# Patient Record
Sex: Female | Born: 1990 | Hispanic: No | Marital: Single | Smoking: Never smoker
Health system: Southern US, Community
[De-identification: ages and names within clinical notes are randomized; demographics above are authoritative.]

## PROBLEM LIST (undated history)

## (undated) DIAGNOSIS — Z8742 Personal history of other diseases of the female genital tract: Secondary | ICD-10-CM

## (undated) DIAGNOSIS — R55 Syncope and collapse: Secondary | ICD-10-CM

## (undated) DIAGNOSIS — E669 Obesity, unspecified: Secondary | ICD-10-CM

## (undated) DIAGNOSIS — E039 Hypothyroidism, unspecified: Secondary | ICD-10-CM

## (undated) DIAGNOSIS — R1013 Epigastric pain: Secondary | ICD-10-CM

## (undated) DIAGNOSIS — E063 Autoimmune thyroiditis: Secondary | ICD-10-CM

## (undated) DIAGNOSIS — F32A Depression, unspecified: Secondary | ICD-10-CM

## (undated) DIAGNOSIS — T7840XA Allergy, unspecified, initial encounter: Secondary | ICD-10-CM

## (undated) DIAGNOSIS — K219 Gastro-esophageal reflux disease without esophagitis: Secondary | ICD-10-CM

## (undated) DIAGNOSIS — E049 Nontoxic goiter, unspecified: Secondary | ICD-10-CM

## (undated) DIAGNOSIS — F419 Anxiety disorder, unspecified: Secondary | ICD-10-CM

## (undated) DIAGNOSIS — I10 Essential (primary) hypertension: Secondary | ICD-10-CM

## (undated) HISTORY — DX: Obesity, unspecified: E66.9

## (undated) HISTORY — DX: Anxiety disorder, unspecified: F41.9

## (undated) HISTORY — DX: Autoimmune thyroiditis: E06.3

## (undated) HISTORY — DX: Allergy, unspecified, initial encounter: T78.40XA

## (undated) HISTORY — DX: Syncope and collapse: R55

## (undated) HISTORY — DX: Epigastric pain: R10.13

## (undated) HISTORY — DX: Gastro-esophageal reflux disease without esophagitis: K21.9

## (undated) HISTORY — DX: Depression, unspecified: F32.A

## (undated) HISTORY — DX: Personal history of other diseases of the female genital tract: Z87.42

## (undated) HISTORY — PX: TYMPANOSTOMY TUBE PLACEMENT: SHX32

## (undated) HISTORY — DX: Essential (primary) hypertension: I10

## (undated) HISTORY — DX: Hypothyroidism, unspecified: E03.9

## (undated) HISTORY — DX: Nontoxic goiter, unspecified: E04.9

## (undated) HISTORY — PX: CLEFT PALATE REPAIR: SUR1165

---
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
## 2012-03-28 ENCOUNTER — Other Ambulatory Visit: Payer: Self-pay | Admitting: *Deleted

## 2012-03-28 DIAGNOSIS — IMO0002 Reserved for concepts with insufficient information to code with codable children: Secondary | ICD-10-CM

## 2012-03-28 DIAGNOSIS — E1065 Type 1 diabetes mellitus with hyperglycemia: Secondary | ICD-10-CM

## 2012-03-31 ENCOUNTER — Other Ambulatory Visit: Payer: Self-pay | Admitting: *Deleted

## 2012-03-31 DIAGNOSIS — R1013 Epigastric pain: Secondary | ICD-10-CM

## 2012-03-31 MED ORDER — RANITIDINE HCL 150 MG PO TABS: 150.0000 mg | ORAL_TABLET | Freq: Two times a day (BID) | ORAL | Status: DC

## 2012-04-01 ENCOUNTER — Other Ambulatory Visit: Payer: Self-pay | Admitting: *Deleted

## 2012-04-01 DIAGNOSIS — R1013 Epigastric pain: Secondary | ICD-10-CM

## 2012-04-01 MED ORDER — RANITIDINE HCL 150 MG PO TABS: 150.0000 mg | ORAL_TABLET | Freq: Two times a day (BID) | ORAL | Status: DC

## 2012-04-22 ENCOUNTER — Ambulatory Visit: Payer: Self-pay | Admitting: "Endocrinology

## 2012-10-06 ENCOUNTER — Ambulatory Visit: Payer: Self-pay

## 2012-10-08 ENCOUNTER — Encounter (HOSPITAL_COMMUNITY): Payer: Self-pay | Admitting: Emergency Medicine

## 2012-10-08 ENCOUNTER — Emergency Department (HOSPITAL_COMMUNITY)
Admission: EM | Admit: 2012-10-08 | Discharge: 2012-10-08 | Disposition: A | Discharge status: Home / Self Care | Payer: Self-pay | Attending: Emergency Medicine | Admitting: Emergency Medicine

## 2012-10-08 DIAGNOSIS — Z113 Encounter for screening for infections with a predominantly sexual mode of transmission: Secondary | ICD-10-CM

## 2012-10-08 DIAGNOSIS — Z202 Contact with and (suspected) exposure to infections with a predominantly sexual mode of transmission: Secondary | ICD-10-CM | POA: Insufficient documentation

## 2012-10-08 DIAGNOSIS — Z3202 Encounter for pregnancy test, result negative: Secondary | ICD-10-CM | POA: Insufficient documentation

## 2012-10-08 DIAGNOSIS — N898 Other specified noninflammatory disorders of vagina: Secondary | ICD-10-CM | POA: Insufficient documentation

## 2012-10-08 DIAGNOSIS — N949 Unspecified condition associated with female genital organs and menstrual cycle: Secondary | ICD-10-CM | POA: Insufficient documentation

## 2012-10-08 DIAGNOSIS — R102 Pelvic and perineal pain: Secondary | ICD-10-CM

## 2012-10-08 DIAGNOSIS — E669 Obesity, unspecified: Secondary | ICD-10-CM | POA: Insufficient documentation

## 2012-10-08 DIAGNOSIS — I10 Essential (primary) hypertension: Secondary | ICD-10-CM | POA: Insufficient documentation

## 2012-10-08 DIAGNOSIS — E039 Hypothyroidism, unspecified: Secondary | ICD-10-CM | POA: Insufficient documentation

## 2012-10-08 DIAGNOSIS — E049 Nontoxic goiter, unspecified: Secondary | ICD-10-CM | POA: Insufficient documentation

## 2012-10-08 DIAGNOSIS — E069 Thyroiditis, unspecified: Secondary | ICD-10-CM | POA: Insufficient documentation

## 2012-10-08 DIAGNOSIS — E119 Type 2 diabetes mellitus without complications: Secondary | ICD-10-CM | POA: Insufficient documentation

## 2012-10-08 DIAGNOSIS — Z8742 Personal history of other diseases of the female genital tract: Secondary | ICD-10-CM | POA: Insufficient documentation

## 2012-10-08 DIAGNOSIS — J45909 Unspecified asthma, uncomplicated: Secondary | ICD-10-CM | POA: Insufficient documentation

## 2012-10-08 LAB — COMPREHENSIVE METABOLIC PANEL
Albumin: 3.9 g/dL (ref 3.5–5.2)
Alkaline Phosphatase: 43 U/L (ref 39–117)
BUN: 10 mg/dL (ref 6–23)
Potassium: 3.5 mEq/L (ref 3.5–5.1)
Sodium: 136 mEq/L (ref 135–145)
Total Protein: 8.2 g/dL (ref 6.0–8.3)

## 2012-10-08 LAB — URINALYSIS, ROUTINE W REFLEX MICROSCOPIC
Bilirubin Urine: NEGATIVE
Leukocytes, UA: NEGATIVE
Nitrite: NEGATIVE
Specific Gravity, Urine: 1.027 (ref 1.005–1.030)
pH: 6.5 (ref 5.0–8.0)

## 2012-10-08 LAB — CBC WITH DIFFERENTIAL/PLATELET
Basophils Relative: 0 % (ref 0–1)
Eosinophils Absolute: 0.1 10*3/uL (ref 0.0–0.7)
MCH: 30.3 pg (ref 26.0–34.0)
MCHC: 34 g/dL (ref 30.0–36.0)
Monocytes Relative: 8 % (ref 3–12)
Neutrophils Relative %: 69 % (ref 43–77)
Platelets: 299 10*3/uL (ref 150–400)

## 2012-10-08 LAB — WET PREP, GENITAL: Clue Cells Wet Prep HPF POC: NONE SEEN

## 2012-10-08 LAB — LIPASE, BLOOD: Lipase: 18 U/L (ref 11–59)

## 2012-10-08 LAB — POCT PREGNANCY, URINE: Preg Test, Ur: NEGATIVE

## 2012-10-08 MED ORDER — AZITHROMYCIN 250 MG PO TABS
1000.0000 mg | ORAL_TABLET | Freq: Once | ORAL | Status: AC
  Administered 2012-10-08: 1000 mg via ORAL
  Filled 2012-10-08: qty 4

## 2012-10-08 MED ORDER — CEFTRIAXONE SODIUM 250 MG IJ SOLR
250.0000 mg | Freq: Once | INTRAMUSCULAR | Status: AC
  Administered 2012-10-08: 250 mg via INTRAMUSCULAR
  Filled 2012-10-08: qty 250

## 2012-10-08 NOTE — ED Provider Notes (Signed)
  Medical screening examination/treatment/procedure(s) were performed by non-physician practitioner and as supervising physician I was immediately available for consultation/collaboration.    Leaf [employer], MD 10/08/12 1600 

## 2012-10-08 NOTE — ED Notes (Signed)
Lower abd pain x 3 days has a vga d/c  Clear and  wanst std check and preg test

## 2012-10-08 NOTE — ED Provider Notes (Signed)
CSN: [id number]     Arrival date & time 10/08/12  4098 History     First MD Initiated Contact with Patient 10/08/12 1029     Chief Complaint  Patient presents with  . Abdominal Pain   (Consider location/radiation/quality/duration/timing/severity/associated sxs/prior Treatment) The history is provided by the patient and medical records.   Patient presented to the ED for bilateral pelvic pain x3 days associated with clear, nonodorous, vaginal discharge.  Pain described as a mild, cramping sensation, not really tenderness.  Patient has one sexual partner, female boyfriend. They were recently sexually active without protection for the first time.  Pt is not on birth control.  Patient expresses concern for possible UTI or STD and would like to be screened. No prior history of STD.  Pt does not have an OB-GYN due to lack of insurance, last annual exam approx 1 year ago.  No recent fevers, sweats, or chills.  No hx of ovarian cysts, fibroids, or ovarian torsion.  Past Medical History  Diagnosis Date  . Prediabetes   . Obesity   . Goiter   . Hypertension   . Thyroiditis, autoimmune   . Dyspepsia   . Hypothyroid   . Fainting episodes   . Diabetes mellitus   . Asthma 11/2009  . H/O: menorrhagia 11/2009  . H/O dysmenorrhea 11/2009  . Thyroid activity decreased    Past Surgical History  Procedure Laterality Date  . Tympanostomy tube placement    . Cleft palate repair     Family History  Problem Relation Age of Onset  . Obesity Mother   . Hypertension Mother   . Obesity Father   . Cancer Maternal Grandmother   . Thyroid disease Neg Hx    History  Substance Use Topics  . Smoking status: Never Smoker   . Smokeless tobacco: Never Used  . Alcohol Use: No   OB History   Grav Para Term Preterm Abortions TAB SAB Ect Mult Living                 Review of Systems  Genitourinary: Positive for vaginal discharge and pelvic pain.  All other systems reviewed and are negative.    Allergies   Review of patient's allergies indicates no known allergies.  Home Medications   Current Outpatient Rx  Name  Route  Sig  Dispense  Refill  . albuterol (PROVENTIL) (2.5 MG/3ML) 0.083% nebulizer solution   Nebulization   Take 3 mLs (2.5 mg total) by nebulization every 6 (six) hours as needed for wheezing.   30 vial   0   . diphenhydrAMINE (SOMINEX) 25 MG tablet   Oral   Take 25-50 mg by mouth daily as needed for allergies.         Marland Kitchen levothyroxine (SYNTHROID, LEVOTHROID) 25 MCG tablet      Take 25 mcg by mouth daily. Brand Name Synthroid only   30 tablet   2   . metFORMIN (GLUCOPHAGE) 500 MG tablet   Oral   Take 1 tablet (500 mg total) by mouth 2 (two) times daily with a meal.   60 tablet   6   . ACCU-CHEK FASTCLIX LANCETS MISC   Does not apply   102 each by Does not apply route 2 times daily at 12 noon and 4 pm. Check sugar 10 x daily   300 each   3     Check blood sugars twice daily and when feeling hy ...    BP 143/83  Pulse 81  Temp(Src) 98.3 F (36.8 C)  Resp 16  SpO2 99%  Physical Exam  Nursing note and vitals reviewed. Constitutional: She is oriented to person, place, and time. She appears well-developed and well-nourished. No distress.  HENT:  Head: Normocephalic and atraumatic.  Mouth/Throat: Oropharynx is clear and moist.  Eyes: Conjunctivae and EOM are normal. Pupils are equal, round, and reactive to light.  Neck: Normal range of motion. Neck supple.  Cardiovascular: Normal rate, regular rhythm and normal heart sounds.   Pulmonary/Chest: Effort normal and breath sounds normal. No respiratory distress. She has no wheezes.  Abdominal: Soft. Bowel sounds are normal. There is no tenderness. There is no guarding.  Genitourinary: Vagina normal. There is no tenderness or lesion on the right labia. There is no tenderness or lesion on the left labia. Cervix exhibits no motion tenderness. Right adnexum displays no mass and no tenderness. Left adnexum displays  no mass and no tenderness. No tenderness or bleeding around the vagina. No vaginal discharge found.  No external lesions, cervical os closed, no vaginal d/c or bleeding, no adnexal or CMT   Musculoskeletal: Normal range of motion.  Neurological: She is alert and oriented to person, place, and time.  Skin: Skin is warm and dry. She is not diaphoretic.  Psychiatric: She has a normal mood and affect.    ED Course   Procedures (including critical care time)  Labs Reviewed  WET PREP, GENITAL - Abnormal; Notable for the following:    WBC, Wet Prep HPF POC FEW (*)    All other components within normal limits  COMPREHENSIVE METABOLIC PANEL - Abnormal; Notable for the following:    Glucose, Bld 107 (*)    All other components within normal limits  URINALYSIS, ROUTINE W REFLEX MICROSCOPIC - Abnormal; Notable for the following:    APPearance HAZY (*)    All other components within normal limits  GC/CHLAMYDIA PROBE AMP  CBC WITH DIFFERENTIAL  LIPASE, BLOOD  POCT PREGNANCY, URINE   No results found.  1. Pelvic pain   2. Screening for STD (sexually transmitted disease)     MDM   u-preg negative.  U/a without signs of infection.  Wet prep with few WBCs.  Other labs largely WNL.  Pt elected to be treated ppx for Gc/Chl.  I have informed her that she will contacted in 48-72 hours if results are positive-- if this is the case she has been treated but partner should seek tx.  I doubt ovarian torsion or acute/surgical abdomen given sx and do not feel that imaging is indicated at this time.  Pt will [facility] with [facility] if further problems occur.  I have discussed labs results and plan with pt, she acknowledged understanding and agreed.  Return precautions advised.  Garlon Hatchet, PA-C 10/08/12 1301  Garlon Hatchet, PA-C 10/08/12 1301

## 2012-10-09 LAB — GC/CHLAMYDIA PROBE AMP
CT Probe RNA: NEGATIVE
GC Probe RNA: NEGATIVE

## 2012-10-10 ENCOUNTER — Telehealth (HOSPITAL_COMMUNITY): Payer: Self-pay | Admitting: *Deleted

## 2012-10-16 ENCOUNTER — Emergency Department (INDEPENDENT_AMBULATORY_CARE_PROVIDER_SITE_OTHER): Payer: Self-pay

## 2012-10-16 ENCOUNTER — Encounter (HOSPITAL_COMMUNITY): Payer: Self-pay | Admitting: *Deleted

## 2012-10-16 ENCOUNTER — Emergency Department (INDEPENDENT_AMBULATORY_CARE_PROVIDER_SITE_OTHER)
Admission: EM | Admit: 2012-10-16 | Discharge: 2012-10-16 | Disposition: A | Discharge status: Home / Self Care | Payer: Self-pay | Source: Home / Self Care

## 2012-10-16 DIAGNOSIS — M7660 Achilles tendinitis, unspecified leg: Secondary | ICD-10-CM

## 2012-10-16 DIAGNOSIS — M659 Synovitis and tenosynovitis, unspecified: Secondary | ICD-10-CM

## 2012-10-16 DIAGNOSIS — M7661 Achilles tendinitis, right leg: Secondary | ICD-10-CM

## 2012-10-16 DIAGNOSIS — M778 Other enthesopathies, not elsewhere classified: Secondary | ICD-10-CM

## 2012-10-16 NOTE — ED Provider Notes (Signed)
CSN: [id number]     Arrival date & time 10/16/12  1554 History     First MD Initiated Contact with Patient 10/16/12 1644     Chief Complaint  Patient presents with  . Foot Injury   (Consider location/radiation/quality/duration/timing/severity/associated sxs/prior Treatment) HPI  22 yo female presents with right posterior heel pain.  States that she was walking at work wihen she felt a "pop" in the back of her heel.  Felt like somebody hit her in the back of her ankle.  Severe pain immediately after.  Gait antalgic.  Was seen by dr Lunette Stands in 2012 for right ankle issues.  Mri showed retrocalcaneal bursitis, achilles tendonitis.  No fracture.  Treated with a cast for a couple of weeks. Has had some pain off and on since 2012 but not this bad.    Past Medical History  Diagnosis Date  . Prediabetes   . Obesity   . Goiter   . Hypertension   . Thyroiditis, autoimmune   . Dyspepsia   . Hypothyroid   . Fainting episodes   . Diabetes mellitus   . Asthma 11/2009  . H/O: menorrhagia 11/2009  . H/O dysmenorrhea 11/2009  . Thyroid activity decreased    Past Surgical History  Procedure Laterality Date  . Tympanostomy tube placement    . Cleft palate repair     Family History  Problem Relation Age of Onset  . Obesity Mother   . Hypertension Mother   . Obesity Father   . Cancer Maternal Grandmother   . Thyroid disease Neg Hx    History  Substance Use Topics  . Smoking status: Never Smoker   . Smokeless tobacco: Never Used  . Alcohol Use: No   OB History   Grav Para Term Preterm Abortions TAB SAB Ect Mult Living                 Review of Systems  Constitutional: Negative.   Eyes: Negative.   Endocrine: Negative.   Genitourinary: Negative.   Musculoskeletal:       Heel pain.    Skin: Negative.   Neurological: Negative.     Allergies  Review of patient's allergies indicates no known allergies.  Home Medications   Current Outpatient Rx  Name  Route  Sig  Dispense   Refill  . ACCU-CHEK FASTCLIX LANCETS MISC   Does not apply   102 each by Does not apply route 2 times daily at 12 noon and 4 pm. Check sugar 10 x daily   300 each   3     Check blood sugars twice daily and when feeling hy ...   . albuterol (PROVENTIL) (2.5 MG/3ML) 0.083% nebulizer solution   Nebulization   Take 3 mLs (2.5 mg total) by nebulization every 6 (six) hours as needed for wheezing.   30 vial   0   . diphenhydrAMINE (SOMINEX) 25 MG tablet   Oral   Take 25-50 mg by mouth daily as needed for allergies.         Marland Kitchen levothyroxine (SYNTHROID, LEVOTHROID) 25 MCG tablet      Take 25 mcg by mouth daily. Brand Name Synthroid only   30 tablet   2   . metFORMIN (GLUCOPHAGE) 500 MG tablet   Oral   Take 1 tablet (500 mg total) by mouth 2 (two) times daily with a meal.   60 tablet   6    BP 143/71  Pulse 78  Temp(Src) 98.5 F (36.9  C) (Oral)  Resp 18  SpO2 99%  LMP 09/17/2012 Physical Exam  Constitutional: She is oriented to person, place, and time. She appears well-developed and well-nourished.  HENT:  Head: Normocephalic and atraumatic.  Eyes: EOM are normal. Pupils are equal, round, and reactive to light.  Neck: Normal range of motion.  Pulmonary/Chest: Effort normal.  Musculoskeletal:  Mod tender over the achilles tendon watershed region.  No tendon defect.  Calcaneal insertion nontender.  Ligaments stable.  No swelling or bruising.  Calf nontender.    Neurological: She is alert and oriented to person, place, and time.  Skin: Skin is warm and dry.  Psychiatric: She has a normal mood and affect.    ED Course   Procedures (including critical care time)  Labs Reviewed - No data to display Dg Os Calcis Right  10/16/2012   *RADIOLOGY REPORT*  Clinical Data: Heel pain.  RIGHT OS CALCIS - 2+ VIEW  Comparison: MRI 05/02/2010.  Findings: The calcaneus is normal.  The tibiotalar and subtalar joints are maintained.  No findings for stress fracture.  No calcaneal heel  spur.  IMPRESSION: Negative radiographs of the calcaneus.   Original Report Authenticated By: Rudie Meyer, M.D.   1. Flexor hallucis longus tendinitis   2. Achilles tendinitis, right     MDM  See dr Jaasia Viglione's note below for ultrasound.  Test showed FHL tendonitis.  Put in a cam walker.  Can WBAT.  Will f/u with dr Mckinley Jewel next week.  All questions answered.  Can use otc anti-inflammatory.    Zonia Kief, PA-C 10/16/12 1759  Clementeen Graham ultrasound note Right ankle limited musculoskeletal ultrasound. Achilles tendon is normal-appearing at the insertion and distally to the gastrocnemius.  There is no significant retrocalcaneal bursitis.  The posterior tibialis tendon is normal appearing as is the flexor digitorum and tibial nerve.  The flexor hallucis longus is intact however surrounded by hypoechoic fluid consistent with tendinitis.   Clementeen Graham ,MD    Rodolph Bong, MD 10/16/12 [phone]

## 2012-10-16 NOTE — ED Notes (Signed)
Pt  Has  Symptoms  Of  Achillis  Heel  Pain    r  Foot   She  Reports  Has  Had  Problems       Like this  In  Past  And  Was  Seen by  Ortho  And  Had  A  Cast  Applied       She  Reports  She  Was  Walking fast  And  Felt a  Pop  In the  Affected  Foot

## 2012-11-28 ENCOUNTER — Encounter (HOSPITAL_COMMUNITY): Payer: Self-pay | Admitting: *Deleted

## 2012-11-28 ENCOUNTER — Emergency Department (HOSPITAL_COMMUNITY)
Admission: EM | Admit: 2012-11-28 | Discharge: 2012-11-28 | Disposition: A | Discharge status: Home / Self Care | Payer: Self-pay | Attending: Emergency Medicine | Admitting: Emergency Medicine

## 2012-11-28 DIAGNOSIS — N61 Mastitis without abscess: Secondary | ICD-10-CM | POA: Insufficient documentation

## 2012-11-28 DIAGNOSIS — I10 Essential (primary) hypertension: Secondary | ICD-10-CM | POA: Insufficient documentation

## 2012-11-28 DIAGNOSIS — Z79899 Other long term (current) drug therapy: Secondary | ICD-10-CM | POA: Insufficient documentation

## 2012-11-28 DIAGNOSIS — E049 Nontoxic goiter, unspecified: Secondary | ICD-10-CM | POA: Insufficient documentation

## 2012-11-28 DIAGNOSIS — Z8742 Personal history of other diseases of the female genital tract: Secondary | ICD-10-CM | POA: Insufficient documentation

## 2012-11-28 DIAGNOSIS — E039 Hypothyroidism, unspecified: Secondary | ICD-10-CM | POA: Insufficient documentation

## 2012-11-28 DIAGNOSIS — N611 Abscess of the breast and nipple: Secondary | ICD-10-CM

## 2012-11-28 DIAGNOSIS — Z8719 Personal history of other diseases of the digestive system: Secondary | ICD-10-CM | POA: Insufficient documentation

## 2012-11-28 DIAGNOSIS — J45909 Unspecified asthma, uncomplicated: Secondary | ICD-10-CM | POA: Insufficient documentation

## 2012-11-28 DIAGNOSIS — E1169 Type 2 diabetes mellitus with other specified complication: Secondary | ICD-10-CM | POA: Insufficient documentation

## 2012-11-28 DIAGNOSIS — E669 Obesity, unspecified: Secondary | ICD-10-CM | POA: Insufficient documentation

## 2012-11-28 MED ORDER — SULFAMETHOXAZOLE-TRIMETHOPRIM 800-160 MG PO TABS: 2.0000 | ORAL_TABLET | Freq: Two times a day (BID) | ORAL | Status: DC

## 2012-11-28 MED ORDER — CEPHALEXIN 500 MG PO CAPS: 500.0000 mg | ORAL_CAPSULE | Freq: Four times a day (QID) | ORAL | Status: DC

## 2012-11-28 NOTE — ED Provider Notes (Signed)
CSN: [id number]     Arrival date & time 11/28/12  1011 History  This chart was scribed for non-physician practitioner Magnus Sinning, PA-C working with Loren Racer, MD by Danella Maiers, ED Scribe. This patient was seen in room TR10C/TR10C and the patient's care was started at 12:08 PM.    Chief Complaint  Patient presents with  . Breast Problem  . Rash   The history is provided by the patient. No language interpreter was used.   HPI Comments: Ashlee Herman is a 22 y.o. female with a h/o DM who presents to the Emergency Department complaining of a growing abscess to the right breast with associated pain, redness, and red, watery drainage onset one month ago. She denies fever, chills, drainage from the nipples. She denies h/o abscesses. No history of DM.  Past Medical History  Diagnosis Date  . Obesity   . Goiter   . Hypertension   . Thyroiditis, autoimmune   . Dyspepsia   . Hypothyroid   . Fainting episodes   . Diabetes mellitus   . Asthma 11/2009  . H/O: menorrhagia 11/2009  . H/O dysmenorrhea 11/2009  . Thyroid activity decreased    Past Surgical History  Procedure Laterality Date  . Tympanostomy tube placement    . Cleft palate repair     Family History  Problem Relation Age of Onset  . Obesity Mother   . Hypertension Mother   . Obesity Father   . Cancer Maternal Grandmother   . Thyroid disease Neg Hx    History  Substance Use Topics  . Smoking status: Never Smoker   . Smokeless tobacco: Never Used  . Alcohol Use: No   OB History   Grav Para Term Preterm Abortions TAB SAB Ect Mult Living                 Review of Systems  All other systems reviewed and are negative.    Allergies  Review of patient's allergies indicates no known allergies.  Home Medications   Current Outpatient Rx  Name  Route  Sig  Dispense  Refill  . ACCU-CHEK FASTCLIX LANCETS MISC   Does not apply   102 each by Does not apply route 2 times daily at 12 noon and 4 pm. Check  sugar 10 x daily   300 each   3     Check blood sugars twice daily and when feeling hy ...   . albuterol (PROVENTIL) (2.5 MG/3ML) 0.083% nebulizer solution   Nebulization   Take 3 mLs (2.5 mg total) by nebulization every 6 (six) hours as needed for wheezing.   30 vial   0   . ibuprofen (ADVIL,MOTRIN) 200 MG tablet   Oral   Take 200 mg by mouth every 6 (six) hours as needed for pain.         Marland Kitchen levothyroxine (SYNTHROID, LEVOTHROID) 25 MCG tablet      Take 25 mcg by mouth daily. Brand Name Synthroid only   30 tablet   2   . metFORMIN (GLUCOPHAGE) 500 MG tablet   Oral   Take 1 tablet (500 mg total) by mouth 2 (two) times daily with a meal.   60 tablet   6    BP 134/84  Pulse 70  Temp(Src) 98.3 F (36.8 C) (Oral)  Resp 20  Ht 5\' 4"  (1.626 m)  Wt 175 lb (79.379 kg)  BMI 30.02 kg/m2  SpO2 98%  LMP 11/18/2012 Physical Exam  Nursing note  and vitals reviewed. Constitutional: She appears well-developed and well-nourished.  HENT:  Head: Normocephalic and atraumatic.  Mouth/Throat: Oropharynx is clear and moist.  Eyes: EOM are normal. Pupils are equal, round, and reactive to light.  Neck: Normal range of motion. Neck supple.  Cardiovascular: Normal rate, regular rhythm and normal heart sounds.   Pulmonary/Chest: Effort normal and breath sounds normal. She has no wheezes.  Musculoskeletal: Normal range of motion.  Neurological: She is alert.  Skin: Skin is warm and dry.  1 cm hyperpigmented area at the 4 o'clock position on the breast. No fluctuance palpated at this time. No drainage at this time. No drainage of the nipple expressed.  Psychiatric: She has a normal mood and affect. Her behavior is normal.    ED Course  Procedures (including critical care time) Medications - No data to display  DIAGNOSTIC STUDIES: Oxygen Saturation is 98% on room air, normal by my interpretation.    COORDINATION OF CARE: 12:29 PM- Discussed treatment plan with pt which includes  treatment with antibiotics and pt agrees to plan.    Labs Review Labs Reviewed - No data to display Imaging Review No results found.  MDM  No diagnosis found. Patient presents with a hyperpigmented area of there right breast.  No fluctuance palpated at this time.  Patient afebrile.  Patient started on antibiotics and instructed to follow up to have the area rechecked.  Return precautions given.  I personally performed the services described in this documentation, which was scribed in my presence. The recorded information has been reviewed and is accurate.    Pascal [facility], PA-C 11/30/12 2229

## 2012-11-28 NOTE — ED Notes (Signed)
Patient states she had an abscess on R breast and has been putting warm compresses on it.  She advised today is started draining and it's still red and swollen.

## 2012-11-28 NOTE — ED Notes (Signed)
Pt. Reported to have pain in right breast where she noticed an area of irritation about a month ago.  Pt. Reported last night she noticed her shirt sticking to her and she said there was an open area on the skin where the irritation had been.  Pt. Reported pain in area of the irritation on right breast rated at a 5/10.

## 2012-11-30 NOTE — ED Provider Notes (Signed)
Medical screening examination/treatment/procedure(s) were performed by non-physician practitioner and as supervising physician I was immediately available for consultation/collaboration.   Loren Racer, MD 11/30/12 [phone]

## 2012-12-03 ENCOUNTER — Encounter (HOSPITAL_COMMUNITY): Payer: Self-pay | Admitting: Emergency Medicine

## 2012-12-03 ENCOUNTER — Emergency Department (INDEPENDENT_AMBULATORY_CARE_PROVIDER_SITE_OTHER)
Admission: EM | Admit: 2012-12-03 | Discharge: 2012-12-03 | Disposition: A | Discharge status: Home / Self Care | Payer: Self-pay | Source: Home / Self Care

## 2012-12-03 DIAGNOSIS — G43909 Migraine, unspecified, not intractable, without status migrainosus: Secondary | ICD-10-CM

## 2012-12-03 MED ORDER — ONDANSETRON 4 MG PO TBDP
4.0000 mg | ORAL_TABLET | Freq: Once | ORAL | Status: AC
  Administered 2012-12-03: 4 mg via ORAL

## 2012-12-03 MED ORDER — TETRACAINE HCL 0.5 % OP SOLN
OPHTHALMIC | Status: AC
  Filled 2012-12-03: qty 2

## 2012-12-03 MED ORDER — HYDROCODONE-ACETAMINOPHEN 5-325 MG PO TABS
2.0000 | ORAL_TABLET | Freq: Once | ORAL | Status: AC
  Administered 2012-12-03: 2 via ORAL

## 2012-12-03 MED ORDER — HYDROCODONE-ACETAMINOPHEN 5-325 MG PO TABS
ORAL_TABLET | ORAL | Status: AC
  Filled 2012-12-03: qty 2

## 2012-12-03 MED ORDER — KETOROLAC TROMETHAMINE 60 MG/2ML IM SOLN
60.0000 mg | Freq: Once | INTRAMUSCULAR | Status: AC
  Administered 2012-12-03: 60 mg via INTRAMUSCULAR

## 2012-12-03 MED ORDER — KETOROLAC TROMETHAMINE 60 MG/2ML IM SOLN
INTRAMUSCULAR | Status: AC
  Filled 2012-12-03: qty 2

## 2012-12-03 MED ORDER — ONDANSETRON HCL 4 MG PO TABS: 4.0000 mg | ORAL_TABLET | Freq: Four times a day (QID) | ORAL | Status: DC

## 2012-12-03 MED ORDER — ONDANSETRON 4 MG PO TBDP
ORAL_TABLET | ORAL | Status: AC
  Filled 2012-12-03: qty 1

## 2012-12-03 NOTE — ED Provider Notes (Signed)
Medical screening examination/treatment/procedure(s) were performed by non-physician practitioner and as supervising physician I was immediately available for consultation/collaboration.  Leslee Home, M.D.  Reuben Likes, MD 12/03/12 [phone]

## 2012-12-03 NOTE — ED Notes (Signed)
Reports no changes when she was brought back.

## 2012-12-03 NOTE — ED Notes (Signed)
Was asked by front staff to assess pt Pt c/o bilateral eye pain onset this am around 0700 before going to work Reports bright light increases pain and when she cries Sxs include: nauseas, dizziness... Wears contacts and has them on Denies: inj/trauma, HA, SOB, CP Adv pt to notify front staff if sxs change.  She is alert w/no signs of acuate distress.

## 2012-12-03 NOTE — ED Provider Notes (Signed)
CSN: [id number]     Arrival date & time 12/03/12  1101 History   None    Chief Complaint  Patient presents with  . Eye Pain   (Consider location/radiation/quality/duration/timing/severity/associated sxs/prior Treatment) HPI Comments: He-year-old female awoke this morning with pain in her eyes particularly with extraocular movement. She also has mild to moderate photophobia as well as a headache around her eyes and temples. Mild nausea and dizziness are associated symptoms. She denies recent head injury, fall or other trauma. She has a history of migraines in years past however they have been less intense, and her last one was approximately 2 years ago. After awakening she put her contacts in and noticed that her vision is essentially normal. Only minor blurring. She states that she could see well enough to drive. No double vision.   Past Medical History  Diagnosis Date  . Obesity   . Goiter   . Hypertension   . Thyroiditis, autoimmune   . Dyspepsia   . Hypothyroid   . Fainting episodes   . Diabetes mellitus   . Asthma 11/2009  . H/O: menorrhagia 11/2009  . H/O dysmenorrhea 11/2009  . Thyroid activity decreased    Past Surgical History  Procedure Laterality Date  . Tympanostomy tube placement    . Cleft palate repair     Family History  Problem Relation Age of Onset  . Obesity Mother   . Hypertension Mother   . Obesity Father   . Cancer Maternal Grandmother   . Thyroid disease Neg Hx    History  Substance Use Topics  . Smoking status: Never Smoker   . Smokeless tobacco: Never Used  . Alcohol Use: No   OB History   Grav Para Term Preterm Abortions TAB SAB Ect Mult Living                 Review of Systems  Constitutional: Negative.   HENT: Negative for ear pain, congestion, sore throat, facial swelling, rhinorrhea, drooling, trouble swallowing, neck pain and postnasal drip.   Eyes: Positive for photophobia and pain. Negative for discharge, redness, itching and visual  disturbance.  Respiratory: Negative.   Cardiovascular: Negative.   Gastrointestinal: Positive for nausea. Negative for vomiting and abdominal pain.  Genitourinary: Negative.   Neurological: Positive for dizziness and headaches. Negative for tremors, seizures, syncope, speech difficulty, weakness and numbness.  Psychiatric/Behavioral: Negative.     Allergies  Review of patient's allergies indicates no known allergies.  Home Medications   Current Outpatient Rx  Name  Route  Sig  Dispense  Refill  . albuterol (PROVENTIL) (2.5 MG/3ML) 0.083% nebulizer solution   Nebulization   Take 3 mLs (2.5 mg total) by nebulization every 6 (six) hours as needed for wheezing.   30 vial   0   . cephALEXin (KEFLEX) 500 MG capsule   Oral   Take 1 capsule (500 mg total) by mouth 4 (four) times daily.   40 capsule   0   . levothyroxine (SYNTHROID, LEVOTHROID) 25 MCG tablet      Take 25 mcg by mouth daily. Brand Name Synthroid only   30 tablet   2   . metFORMIN (GLUCOPHAGE) 500 MG tablet   Oral   Take 1 tablet (500 mg total) by mouth 2 (two) times daily with a meal.   60 tablet   6   . sulfamethoxazole-trimethoprim (SEPTRA DS) 800-160 MG per tablet   Oral   Take 2 tablets by mouth 2 (two) times daily.  40 tablet   0   . ACCU-CHEK FASTCLIX LANCETS MISC   Does not apply   102 each by Does not apply route 2 times daily at 12 noon and 4 pm. Check sugar 10 x daily   300 each   3     Check blood sugars twice daily and when feeling hy ...   . ibuprofen (ADVIL,MOTRIN) 200 MG tablet   Oral   Take 200 mg by mouth every 6 (six) hours as needed for pain.         Marland Kitchen ondansetron (ZOFRAN) 4 MG tablet   Oral   Take 1 tablet (4 mg total) by mouth every 6 (six) hours.   12 tablet   0    BP 116/81  Pulse 86  Temp(Src) 98.5 F (36.9 C) (Oral)  Resp 16  SpO2 100%  LMP 11/18/2012 Physical Exam  Nursing note and vitals reviewed. Constitutional: She is oriented to person, place, and time.  She appears well-developed and well-nourished.  HENT:  Head: Normocephalic and atraumatic.  Right Ear: External ear normal.  Left Ear: External ear normal.  Mouth/Throat: Oropharynx is clear and moist. No oropharyngeal exudate.  Eyes: Conjunctivae and EOM are normal. Pupils are equal, round, and reactive to light. Right eye exhibits no discharge. Left eye exhibits no discharge. No scleral icterus.  Funduscopic examination of the left eye reveals no bleeding and the optic disc is sharp. The right eye was not as easily visible (examiner technique), however no bleeding was seen and a portion of the optic disc was seen and sharp. The ocular pressure of the right eye average was 14, left eye 16.  Neck: Normal range of motion. Neck supple.  Cardiovascular: Normal rate and normal heart sounds.   Pulmonary/Chest: Effort normal. No respiratory distress.  Musculoskeletal: She exhibits no edema and no tenderness.  Lymphadenopathy:    She has no cervical adenopathy.  Neurological: She is alert and oriented to person, place, and time. No cranial nerve deficit. She exhibits normal muscle tone.  Skin: Skin is warm and dry.  Psychiatric: She has a normal mood and affect.    ED Course  Procedures (including critical care time) Labs Review Labs Reviewed  GLUCOSE, CAPILLARY   Imaging Review No results found. BS 77 MDM   1. Migraine headache     1330 hours: Patient states her headache has improved but she continues to have extraocular muscle pain. Her nausea has abated. The eye exam is essentially normal. She does have pain in the extraocular muscles with movement. There is pain around the temples but no tenderness. There is an association with photophobia and nausea I suspect she has a migraine headache. Toradol 60 mg IM Zofran 4 mg by mouth. Check fingerstick glucose. The patient is discharged in a stable and improved condition at 1330 hours and will be given 2 Norco 5 mg by mouth tablets now.  Her father will be driving her home. Hopefully she will be able to rest or sleep and break the cycle of pain. She knows that if she gets worse or develops new symptoms that we discussed and  written instructions she is to go promptly to the emergency department.  Hayden Rasmussen, NP 12/03/12 [phone]

## 2012-12-05 ENCOUNTER — Emergency Department (HOSPITAL_COMMUNITY)
Admission: EM | Admit: 2012-12-05 | Discharge: 2012-12-05 | Disposition: A | Discharge status: Home / Self Care | Payer: Self-pay | Attending: Emergency Medicine | Admitting: Emergency Medicine

## 2012-12-05 ENCOUNTER — Other Ambulatory Visit (HOSPITAL_COMMUNITY): Payer: Self-pay

## 2012-12-05 ENCOUNTER — Encounter (HOSPITAL_COMMUNITY): Payer: Self-pay | Admitting: Physical Medicine and Rehabilitation

## 2012-12-05 ENCOUNTER — Emergency Department (HOSPITAL_COMMUNITY): Payer: Self-pay

## 2012-12-05 DIAGNOSIS — G43801 Other migraine, not intractable, with status migrainosus: Secondary | ICD-10-CM | POA: Insufficient documentation

## 2012-12-05 DIAGNOSIS — Z862 Personal history of diseases of the blood and blood-forming organs and certain disorders involving the immune mechanism: Secondary | ICD-10-CM | POA: Insufficient documentation

## 2012-12-05 DIAGNOSIS — R110 Nausea: Secondary | ICD-10-CM | POA: Insufficient documentation

## 2012-12-05 DIAGNOSIS — Z8719 Personal history of other diseases of the digestive system: Secondary | ICD-10-CM | POA: Insufficient documentation

## 2012-12-05 DIAGNOSIS — I10 Essential (primary) hypertension: Secondary | ICD-10-CM | POA: Insufficient documentation

## 2012-12-05 DIAGNOSIS — Z8639 Personal history of other endocrine, nutritional and metabolic disease: Secondary | ICD-10-CM | POA: Insufficient documentation

## 2012-12-05 DIAGNOSIS — E039 Hypothyroidism, unspecified: Secondary | ICD-10-CM | POA: Insufficient documentation

## 2012-12-05 DIAGNOSIS — E119 Type 2 diabetes mellitus without complications: Secondary | ICD-10-CM | POA: Insufficient documentation

## 2012-12-05 DIAGNOSIS — H53149 Visual discomfort, unspecified: Secondary | ICD-10-CM | POA: Insufficient documentation

## 2012-12-05 DIAGNOSIS — J45909 Unspecified asthma, uncomplicated: Secondary | ICD-10-CM | POA: Insufficient documentation

## 2012-12-05 DIAGNOSIS — H538 Other visual disturbances: Secondary | ICD-10-CM | POA: Insufficient documentation

## 2012-12-05 DIAGNOSIS — H9209 Otalgia, unspecified ear: Secondary | ICD-10-CM | POA: Insufficient documentation

## 2012-12-05 DIAGNOSIS — Z8742 Personal history of other diseases of the female genital tract: Secondary | ICD-10-CM | POA: Insufficient documentation

## 2012-12-05 DIAGNOSIS — Z79899 Other long term (current) drug therapy: Secondary | ICD-10-CM | POA: Insufficient documentation

## 2012-12-05 DIAGNOSIS — E669 Obesity, unspecified: Secondary | ICD-10-CM | POA: Insufficient documentation

## 2012-12-05 LAB — GLUCOSE, CAPILLARY: Glucose-Capillary: 112 mg/dL — ABNORMAL HIGH (ref 70–99)

## 2012-12-05 MED ORDER — DIPHENHYDRAMINE HCL 50 MG/ML IJ SOLN
25.0000 mg | Freq: Once | INTRAMUSCULAR | Status: AC
  Administered 2012-12-05: 25 mg via INTRAMUSCULAR
  Filled 2012-12-05: qty 1

## 2012-12-05 MED ORDER — SUMATRIPTAN SUCCINATE 100 MG PO TABS: 100.0000 mg | ORAL_TABLET | ORAL | Status: DC | PRN

## 2012-12-05 MED ORDER — METOCLOPRAMIDE HCL 5 MG/ML IJ SOLN
10.0000 mg | Freq: Once | INTRAMUSCULAR | Status: AC
  Administered 2012-12-05: 10 mg via INTRAMUSCULAR
  Filled 2012-12-05: qty 2

## 2012-12-05 MED ORDER — DEXAMETHASONE SODIUM PHOSPHATE 10 MG/ML IJ SOLN
10.0000 mg | Freq: Once | INTRAMUSCULAR | Status: AC
  Administered 2012-12-05: 10 mg via INTRAMUSCULAR
  Filled 2012-12-05: qty 1

## 2012-12-05 NOTE — ED Provider Notes (Signed)
CSN: [id number]     Arrival date & time 12/05/12  4098 History  This chart was scribed for Dierdre Forth, PA working with Vida Roller, MD by Quintella Reichert, ED Scribe. This patient was seen in room TR04C/TR04C and the patient's care was started at 10:13 AM.  Chief Complaint  Patient presents with  . Eye Pain    The history is provided by the patient. No language interpreter was used.    HPI Comments: Ashlee Herman is a 22 y.o. female with h/o DM, HTN, Hashimoto's thyroiditis, hypothyroidism and obesity who presents to the Emergency Department complaining of progressively-worsening severe bilateral eye pain that began 2 days ago, with associated photophobia and nausea.  Pt states she awoke 2 days ago with a pressure pain behind and above both eyes and was unable to look left or right without severely exacerbating her pain.  Pain is equal in both eyes and is not associated with redness, itching, burning or discharge.  She denies any injury to the eyes prior to onset of pain.  She went to work that day and later began feeling nauseated.  She has not vomited but states "I almost did."  Pt also notes some transient blurred and double vision that day.  She was seen in the ED the day her symptoms began and was informed that her symptoms were most likely due to a severe migraine.  She was given 2 hydrocodone pills and anti-emetics, which provided relief temporarily and allowed her to fall asleep for several hours.  However she awoke from her nap with her symptoms equally as severe as before.  Symptoms also continued throughout the day yesterday.  This morning on waking pt states her pain and nausea had both become more severe.  She also states her sensitivity to eye movements has worsened and presently "the slightest head movement or eye movement hurts."  Today she has attempted to treat pain with ibuprofen 800 mg and warm compresses, without relief.  Pt also complains of intermittent bilateral ear  pain.  She denies sinus congestion, rhinorrhea, or chest congestion.  She states she has had migraines before but her present pain does not feel similar in quality and is more severe.  She denies recent falls or head traumas.  Pt wears contacts.  She is not on anticoagulants.  She medicates regularly with metformin and Synthroid and is currently on antibiotics which she was prescribed for a skin infection.   Past Medical History  Diagnosis Date  . Obesity   . Goiter   . Hypertension   . Thyroiditis, autoimmune   . Dyspepsia   . Hypothyroid   . Fainting episodes   . Diabetes mellitus   . Asthma 11/2009  . H/O: menorrhagia 11/2009  . H/O dysmenorrhea 11/2009  . Thyroid activity decreased     Past Surgical History  Procedure Laterality Date  . Tympanostomy tube placement    . Cleft palate repair      Family History  Problem Relation Age of Onset  . Obesity Mother   . Hypertension Mother   . Obesity Father   . Cancer Maternal Grandmother   . Thyroid disease Neg Hx     History  Substance Use Topics  . Smoking status: Never Smoker   . Smokeless tobacco: Never Used  . Alcohol Use: No    OB History   Grav Para Term Preterm Abortions TAB SAB Ect Mult Living  Review of Systems  Constitutional: Negative for fever, diaphoresis, appetite change, fatigue and unexpected weight change.  HENT: Positive for ear pain. Negative for mouth sores and neck stiffness.   Eyes: Positive for photophobia, pain and visual disturbance (transient blurred/double vision). Negative for discharge and redness.  Respiratory: Negative for cough, chest tightness, shortness of breath and wheezing.   Cardiovascular: Negative for chest pain.  Gastrointestinal: Positive for nausea. Negative for vomiting, abdominal pain, diarrhea and constipation.  Endocrine: Negative for polydipsia, polyphagia and polyuria.  Genitourinary: Negative for dysuria, urgency, frequency and hematuria.   Musculoskeletal: Negative for back pain.  Skin: Negative for rash.  Allergic/Immunologic: Negative for immunocompromised state.  Neurological: Positive for headaches (pain localized behind and above both eyes). Negative for syncope and light-headedness.  Hematological: Does not bruise/bleed easily.  Psychiatric/Behavioral: Negative for sleep disturbance. The patient is not nervous/anxious.      Allergies  Review of patient's allergies indicates no known allergies.  Home Medications   Current Outpatient Rx  Name  Route  Sig  Dispense  Refill  . albuterol (PROVENTIL) (2.5 MG/3ML) 0.083% nebulizer solution   Nebulization   Take 3 mLs (2.5 mg total) by nebulization every 6 (six) hours as needed for wheezing.   30 vial   0   . cephALEXin (KEFLEX) 500 MG capsule   Oral   Take 1 capsule (500 mg total) by mouth 4 (four) times daily.   40 capsule   0   . ibuprofen (ADVIL,MOTRIN) 200 MG tablet   Oral   Take 800 mg by mouth every 6 (six) hours as needed for pain.          Marland Kitchen levothyroxine (SYNTHROID, LEVOTHROID) 25 MCG tablet      Take 25 mcg by mouth daily. Brand Name Synthroid only   30 tablet   2   . metFORMIN (GLUCOPHAGE) 500 MG tablet   Oral   Take 1 tablet (500 mg total) by mouth 2 (two) times daily with a meal.   60 tablet   6   . ondansetron (ZOFRAN) 4 MG tablet   Oral   Take 1 tablet (4 mg total) by mouth every 6 (six) hours.   12 tablet   0   . sulfamethoxazole-trimethoprim (SEPTRA DS) 800-160 MG per tablet   Oral   Take 2 tablets by mouth 2 (two) times daily.   40 tablet   0   . ACCU-CHEK FASTCLIX LANCETS MISC   Does not apply   102 each by Does not apply route 2 times daily at 12 noon and 4 pm. Check sugar 10 x daily   300 each   3     Check blood sugars twice daily and when feeling hy ...    BP 135/82  Pulse 104  Temp(Src) 99.1 F (37.3 C) (Oral)  Resp 18  Ht 5\' 5"  (1.651 m)  Wt 175 lb (79.379 kg)  BMI 29.12 kg/m2  SpO2 97%  LMP  11/18/2012  Physical Exam  Nursing note and vitals reviewed. Constitutional: She is oriented to person, place, and time. She appears well-developed and well-nourished. No distress.  Awake, alert, nontoxic appearance  HENT:  Head: Normocephalic and atraumatic.  Right Ear: Tympanic membrane is scarred. Tympanic membrane is not erythematous and not bulging.  Left Ear: Tympanic membrane is scarred. Tympanic membrane is not erythematous and not bulging.  Mouth/Throat: Oropharynx is clear and moist. No oropharyngeal exudate.  TMs scarred but no bulging or erythema. No tenderness to palpation of sinuses.  Eyes: Conjunctivae, EOM and lids are normal. Pupils are equal, round, and reactive to light. Lids are everted and swept, no foreign bodies found. Right eye exhibits no chemosis, no discharge, no exudate and no hordeolum. No foreign body present in the right eye. Left eye exhibits no chemosis, no discharge, no exudate and no hordeolum. No foreign body present in the left eye. Right conjunctiva is not injected. Right conjunctiva has no hemorrhage. Left conjunctiva is not injected. Left conjunctiva has no hemorrhage. No scleral icterus. Right eye exhibits no nystagmus. Left eye exhibits no nystagmus.  Fundoscopic exam:      The right eye shows no arteriolar narrowing, no AV nicking, no exudate, no hemorrhage and no papilledema.       The left eye shows no arteriolar narrowing, no AV nicking, no exudate, no hemorrhage and no papilledema.  No hemorrhage or papilledema, no diplopia, no nystagmus Mild pain to palpation of the globe.  The globe is soft.   IOP 18 in right eye and 19 in left. Pain with extraocular movements but no diplopia  Visual Acuity-Corrected:    R Distance:  20/20 ;  L Distance:  20/25  Neck: Normal range of motion and full passive range of motion without pain. Neck supple. No spinous process tenderness and no muscular tenderness present. No rigidity. Normal range of motion present.   No nuchal rigidity No midline tenderness or paraspinal tenderness to palpation the patient does complain of neck pain  Cardiovascular: Normal rate, regular rhythm, normal heart sounds and intact distal pulses.   Pulmonary/Chest: Effort normal and breath sounds normal. No respiratory distress. She has no wheezes.  Abdominal: Soft. Bowel sounds are normal. She exhibits no mass. There is no tenderness. There is no rebound and no guarding.  Musculoskeletal: Normal range of motion. She exhibits no edema.  Lymphadenopathy:       Head (right side): No submental, no submandibular, no tonsillar, no preauricular, no posterior auricular and no occipital adenopathy present.       Head (left side): No submental, no submandibular, no tonsillar, no preauricular, no posterior auricular and no occipital adenopathy present.    She has no cervical adenopathy.       Right cervical: No superficial cervical, no deep cervical and no posterior cervical adenopathy present.      Left cervical: No superficial cervical, no deep cervical and no posterior cervical adenopathy present.  Neurological: She is alert and oriented to person, place, and time. She has normal reflexes. No cranial nerve deficit. She exhibits normal muscle tone. Coordination normal.  Speech is clear and goal oriented, follows commands Major Cranial nerves without deficit, no facial droop Normal strength in upper and lower extremities bilaterally including dorsiflexion and plantar flexion, strong and equal grip strength Sensation normal to light and sharp touch Moves extremities without ataxia, coordination intact Normal finger to nose and rapid alternating movements Neg romberg, no pronator drift Normal gait and balance  Skin: Skin is warm and dry. No rash noted. She is not diaphoretic. No erythema.  No petechial rash  Psychiatric: She has a normal mood and affect. Her behavior is normal.    ED Course  Procedures (including critical care  time)  DIAGNOSTIC STUDIES: Oxygen Saturation is 97% on room air, normal by my interpretation.    COORDINATION OF CARE: 10:31 AM-Discussed treatment plan which includes CT-scan and evaluation by attending physician with pt at bedside and pt agreed to plan.   Labs Review Labs Reviewed  GLUCOSE, CAPILLARY -  Abnormal; Notable for the following:    Glucose-Capillary 112 (*)    All other components within normal limits    Imaging Review Ct Head Wo Contrast  12/05/2012   CLINICAL DATA:  Severe pain  EXAM: CT HEAD WITHOUT CONTRAST  TECHNIQUE: Contiguous axial images were obtained from the base of the skull through the vertex without intravenous contrast.  COMPARISON:  None.  FINDINGS: There is no evidence for acute hemorrhage, hydrocephalus, mass lesion, or abnormal extra-axial fluid collection. No definite CT evidence for acute infarction. The visualized paranasal sinuses and mastoid air cells are clear.  IMPRESSION: No acute intracranial abnormality.   Electronically Signed   By: Kennith Center M.D.   On: 12/05/2012 11:29    MDM   1. Ocular migraine with status migrainosus      CAMYLA CAMPOSANO presents with persistent headache and eye pain for 3 days.  Patient reports mild changes in vision, but visual acuity here is within normal limits.  Intraocular pressure within normal limits, no concern for acute angle glaucoma the patient is at increased risk because of her diabetes.  No erythema, conjunctival or scleral injection, tearing or discharge from eye or feeling of foreign body in the eye, unlikely corneal abrasion the patient is a contact lens wearer.  No hemorrhages or papilledema noted on funduscopic exam, unlikely increased intracranial pressure.  Likely migrainous in nature however his headache is more severe and different from previous migraines will obtain CT scan.  Patient is afebrile, non-tachycardic, nontoxic, nonseptic appearing. She is without petechial rash or nuchal rigidity;  unlikely meningitis.  11:36 AM Head CT negative, will treat as occular migraine.   I personally reviewed the imaging tests through PACS system.  I reviewed available ER/hospitalization records through the EMR.  Pt remains alert, oriented, nontoxic, nonseptic appearing, afebrile, non-tachycardic on my exam.  Discussed with Ashlee Anis, PA-C who will monitor and disposition accordingly.  Patient currently stable.   I personally performed the services described in this documentation, which was scribed in my presence. The recorded information has been reviewed and is accurate.   Dahlia Client Gracey Tolle, PA-C 12/05/12 1136

## 2012-12-05 NOTE — ED Provider Notes (Signed)
22 year old female who presents with a complaint of ocular headache. This is bilateral, not associated with discharge, runny eyes, crusting or redness. The symptoms are persistent, she was arteries he had an urgent care where she was found abnormal intraocular pressure. On my exam she is clear conjunctiva, normal reactive pupils, normal extraocular movements, normal speech, normal coordination.  Intraocular pressure on my measurement is approximately 18-20 bilaterally.    Medical screening examination/treatment/procedure(s) were conducted as a shared visit with non-physician practitioner(s) and myself.  I personally evaluated the patient during the encounter.  Clinical Impression: Ocular Headache  Vida Roller, MD 12/08/12 [phone]

## 2012-12-05 NOTE — ED Notes (Signed)
Pt presents to department for evaluation of soreness to both eyes, onset Wednesday. 10/10 pain upon arrival. Also states blurred vision. She is alert and oriented x4. Ambulatory to exam room. No neurological deficits noted.

## 2012-12-05 NOTE — ED Notes (Signed)
CBG 112.  

## 2012-12-08 NOTE — ED Provider Notes (Signed)
Medical screening examination/treatment/procedure(s) were conducted as a shared visit with non-physician practitioner(s) and myself.  I personally evaluated the patient during the encounter  Please see my separate respective documentation pertaining to this patient encounter   Vida Roller, MD 12/08/12 [phone]

## 2013-03-26 ENCOUNTER — Emergency Department (INDEPENDENT_AMBULATORY_CARE_PROVIDER_SITE_OTHER)
Admission: EM | Admit: 2013-03-26 | Discharge: 2013-03-26 | Disposition: A | Discharge status: Home / Self Care | Payer: Self-pay | Source: Home / Self Care | Attending: Family Medicine | Admitting: Family Medicine

## 2013-03-26 ENCOUNTER — Encounter (HOSPITAL_COMMUNITY): Payer: Self-pay | Admitting: Emergency Medicine

## 2013-03-26 DIAGNOSIS — J069 Acute upper respiratory infection, unspecified: Secondary | ICD-10-CM

## 2013-03-26 DIAGNOSIS — H6690 Otitis media, unspecified, unspecified ear: Secondary | ICD-10-CM

## 2013-03-26 DIAGNOSIS — B9789 Other viral agents as the cause of diseases classified elsewhere: Secondary | ICD-10-CM

## 2013-03-26 DIAGNOSIS — H6691 Otitis media, unspecified, right ear: Secondary | ICD-10-CM

## 2013-03-26 MED ORDER — AMOXICILLIN-POT CLAVULANATE 875-125 MG PO TABS: 1.0000 | ORAL_TABLET | Freq: Two times a day (BID) | ORAL | Status: AC

## 2013-03-26 NOTE — ED Provider Notes (Signed)
CSN: [id number]     Arrival date & time 03/26/13  1555 History   First MD Initiated Contact with Patient 03/26/13 1656     Chief Complaint  Patient presents with  . URI   (Consider location/radiation/quality/duration/timing/severity/associated sxs/prior Treatment) HPI  23 year old F with 4 day history of cough, congestion, sinus pressure, ear pain, sore throat and wheezing. She also has generalized muscle aches. She denies fever but does note chills. She has baseline asthma and has been taking albuterol as needed, and only required 4 puffs today. For her other symptoms she has tried DayQuil, NyQuil, Mucinex and other over-the-counter medications without improvement. Overall she feels like her symptoms are worsening.   She denies shortness of breath and chest pain at this time.  Past Medical History  Diagnosis Date  . Obesity   . Goiter   . Hypertension   . Thyroiditis, autoimmune   . Dyspepsia   . Hypothyroid   . Fainting episodes   . Diabetes mellitus   . Asthma 11/2009  . H/O: menorrhagia 11/2009  . H/O dysmenorrhea 11/2009  . Thyroid activity decreased    Past Surgical History  Procedure Laterality Date  . Tympanostomy tube placement    . Cleft palate repair     Family History  Problem Relation Age of Onset  . Obesity Mother   . Hypertension Mother   . Obesity Father   . Cancer Maternal Grandmother   . Thyroid disease Neg Hx    History  Substance Use Topics  . Smoking status: Never Smoker   . Smokeless tobacco: Never Used  . Alcohol Use: No   OB History   Grav Para Term Preterm Abortions TAB SAB Ect Mult Living                 Review of Systems See history of present illness Allergies  Review of patient's allergies indicates no known allergies.  Home Medications   Current Outpatient Rx  Name  Route  Sig  Dispense  Refill  . albuterol (PROVENTIL) (2.5 MG/3ML) 0.083% nebulizer solution   Nebulization   Take 3 mLs (2.5 mg total) by nebulization every 6  (six) hours as needed for wheezing.   30 vial   0   . levothyroxine (SYNTHROID, LEVOTHROID) 25 MCG tablet      Take 25 mcg by mouth daily. Brand Name Synthroid only   30 tablet   2   . metFORMIN (GLUCOPHAGE) 500 MG tablet   Oral   Take 1 tablet (500 mg total) by mouth 2 (two) times daily with a meal.   60 tablet   6   . ACCU-CHEK FASTCLIX LANCETS MISC   Does not apply   102 each by Does not apply route 2 times daily at 12 noon and 4 pm. Check sugar 10 x daily   300 each   3     Check blood sugars twice daily and when feeling hy ...   . amoxicillin-clavulanate (AUGMENTIN) 875-125 MG per tablet   Oral   Take 1 tablet by mouth 2 (two) times daily.   20 tablet   0   . cephALEXin (KEFLEX) 500 MG capsule   Oral   Take 1 capsule (500 mg total) by mouth 4 (four) times daily.   40 capsule   0   . ibuprofen (ADVIL,MOTRIN) 200 MG tablet   Oral   Take 800 mg by mouth every 6 (six) hours as needed for pain.          .Marland Kitchen  ondansetron (ZOFRAN) 4 MG tablet   Oral   Take 1 tablet (4 mg total) by mouth every 6 (six) hours.   12 tablet   0   . sulfamethoxazole-trimethoprim (SEPTRA DS) 800-160 MG per tablet   Oral   Take 2 tablets by mouth 2 (two) times daily.   40 tablet   0   . SUMAtriptan (IMITREX) 100 MG tablet   Oral   Take 1 tablet (100 mg total) by mouth every 2 (two) hours as needed for migraine. May repeat in 2 hours if headache persists or recurs. MAXIMUM 2 DOSES IN 24 HOURS   10 tablet   0    BP 151/59  Pulse 65  Temp(Src) 98.6 F (37 C) (Oral)  SpO2 100%  LMP 02/06/2013 Physical Exam Gen: overweight young female, well appearing, NAD, pleasant and conversant HEENT: NCAT, PERRLA, EOMI, OP clear and moist,mild submandibular lymphadenopathy bilaterally; no tonsillar exudates; right ear with bulging tympanic membrane and posterior purulent material CV: RRR, no m/r/g, no JVD or carotid bruits Pulm: normal WOB, CTA-B persistently coughing nonproductive Abd: soft,  NDNT, NABS Skin: warm, dry, no rashes Neuro/Psych: A&Ox4, normal affect, speech, and thought content  ED Course  Procedures (including critical care time) Labs Review Labs Reviewed - No data to display Imaging Review No results found.    MDM   1. Otitis media, right   2. Viral URI with cough    Given painful symptomatic right OM with lots of purulent material, will start treating with Augmentin. Also, counseled on expectant mgt for viral URI. No evidence of pulmonary disease on exam.     Garnetta Buddy, MD 03/26/13 [phone]

## 2013-03-26 NOTE — ED Notes (Signed)
Pt c/o cold sxs onset 4 days Sxs include: BA, dry cough, congestion, HA Denies: f/v/n/d, SOB, wheezing Alert w/no signs of acute distress.

## 2013-03-26 NOTE — ED Provider Notes (Signed)
Medical screening examination/treatment/procedure(s) were performed by resident physician or non-physician practitioner and as supervising physician I was immediately available for consultation/collaboration.   Gohan Collister DOUGLAS MD.   Samiyah Stupka D Ladale Sherburn, MD 03/26/13 2003 

## 2013-03-26 NOTE — Discharge Instructions (Signed)
Otitis Media With Effusion Otitis media with effusion is the presence of fluid in the middle ear. This is a common problem in children, which often follows ear infections. It may be present for weeks or longer after the infection. Unlike an acute ear infection, otitis media with effusion refers only to fluid behind the ear drum and not infection. Children with repeated ear and sinus infections and allergy problems are the most likely to get otitis media with effusion. CAUSES  The most frequent cause of the fluid buildup is dysfunction of the eustachian tubes. These are the tubes that drain fluid in the ears to the to the back of the nose (nasopharynx). SYMPTOMS   The main symptom of this condition is hearing loss. As a result, you or your child may:  Listen to the TV at a loud volume.  Not respond to questions.  Ask "what" often when spoken to.  Mistake or confuse on sound or word for another.  There may be a sensation of fullness or pressure but usually not pain. DIAGNOSIS   Your health care provider will diagnose this condition by examining you or your child's ears.  Your health care provider may test the pressure in you or your child's ear with a tympanometer.  A hearing test may be conducted if the problem persists. TREATMENT   Treatment depends on the duration and the effects of the effusion.  Antibiotics, decongestants, nose drops, and cortisone-type drugs (tablets or nasal spray) may not be helpful.  Children with persistent ear effusions may have delayed language or behavioral problems. Children at risk for developmental delays in hearing, learning, and speech may require referral to a specialist earlier than children not at risk.  You or your child's health care provider may suggest a referral to an ear, nose, and throat surgeon for treatment. The following may help restore normal hearing:  Drainage of fluid.  Placement of ear tubes (tympanostomy tubes).  Removal of  adenoids (adenoidectomy). HOME CARE INSTRUCTIONS   Avoid second hand smoke.  Infants who are breast fed are less likely to have this condition.  Avoid feeding infants while laying flat.  Avoid known environmental allergens.  Avoid people who are sick. SEEK MEDICAL CARE IF:   Hearing is not better in 3 months.  Hearing is worse.  Ear pain.  Drainage from the ear.  Dizziness. MAKE SURE YOU:   Understand these instructions.  Will watch your condition.  Will get help right away if you are not doing well or get worse. Document Released: 03/29/2004 Document Revised: 12/10/2012 Document Reviewed: 09/16/2012 [employer] Patient Information 2014 [employer].  

## 2013-03-26 NOTE — ED Provider Notes (Signed)
CSN: [id number]     Arrival date & time 03/26/13  1555 History   First MD Initiated Contact with Patient 03/26/13 1656     Chief Complaint  Patient presents with  . URI   (Consider location/radiation/quality/duration/timing/severity/associated sxs/prior Treatment) HPI  Past Medical History  Diagnosis Date  . Obesity   . Goiter   . Hypertension   . Thyroiditis, autoimmune   . Dyspepsia   . Hypothyroid   . Fainting episodes   . Diabetes mellitus   . Asthma 11/2009  . H/O: menorrhagia 11/2009  . H/O dysmenorrhea 11/2009  . Thyroid activity decreased    Past Surgical History  Procedure Laterality Date  . Tympanostomy tube placement    . Cleft palate repair     Family History  Problem Relation Age of Onset  . Obesity Mother   . Hypertension Mother   . Obesity Father   . Cancer Maternal Grandmother   . Thyroid disease Neg Hx    History  Substance Use Topics  . Smoking status: Never Smoker   . Smokeless tobacco: Never Used  . Alcohol Use: No   OB History   Grav Para Term Preterm Abortions TAB SAB Ect Mult Living                 Review of Systems  Allergies  Review of patient's allergies indicates no known allergies.  Home Medications   Current Outpatient Rx  Name  Route  Sig  Dispense  Refill  . albuterol (PROVENTIL) (2.5 MG/3ML) 0.083% nebulizer solution   Nebulization   Take 3 mLs (2.5 mg total) by nebulization every 6 (six) hours as needed for wheezing.   30 vial   0   . levothyroxine (SYNTHROID, LEVOTHROID) 25 MCG tablet      Take 25 mcg by mouth daily. Brand Name Synthroid only   30 tablet   2   . metFORMIN (GLUCOPHAGE) 500 MG tablet   Oral   Take 1 tablet (500 mg total) by mouth 2 (two) times daily with a meal.   60 tablet   6   . ACCU-CHEK FASTCLIX LANCETS MISC   Does not apply   102 each by Does not apply route 2 times daily at 12 noon and 4 pm. Check sugar 10 x daily   300 each   3     Check blood sugars twice daily and when feeling hy  ...   . amoxicillin-clavulanate (AUGMENTIN) 875-125 MG per tablet   Oral   Take 1 tablet by mouth 2 (two) times daily.   20 tablet   0   . cephALEXin (KEFLEX) 500 MG capsule   Oral   Take 1 capsule (500 mg total) by mouth 4 (four) times daily.   40 capsule   0   . ibuprofen (ADVIL,MOTRIN) 200 MG tablet   Oral   Take 800 mg by mouth every 6 (six) hours as needed for pain.          Marland Kitchen ondansetron (ZOFRAN) 4 MG tablet   Oral   Take 1 tablet (4 mg total) by mouth every 6 (six) hours.   12 tablet   0   . sulfamethoxazole-trimethoprim (SEPTRA DS) 800-160 MG per tablet   Oral   Take 2 tablets by mouth 2 (two) times daily.   40 tablet   0   . SUMAtriptan (IMITREX) 100 MG tablet   Oral   Take 1 tablet (100 mg total) by mouth every 2 (two) hours as  needed for migraine. May repeat in 2 hours if headache persists or recurs. MAXIMUM 2 DOSES IN 24 HOURS   10 tablet   0    BP 151/59  Pulse 65  Temp(Src) 98.6 F (37 C) (Oral)  SpO2 100%  LMP 02/06/2013 Physical Exam  ED Course  Procedures (including critical care time) Labs Review Labs Reviewed - No data to display Imaging Review No results found.  EKG Interpretation    Date/Time:    Ventricular Rate:    PR Interval:    QRS Duration:   QT Interval:    QTC Calculation:   R Axis:     Text Interpretation:              MDM      Linna HoffJames D Diesha Rostad, MD 03/27/13 1317

## 2013-06-02 ENCOUNTER — Ambulatory Visit: Payer: Self-pay | Attending: Internal Medicine

## 2013-06-30 ENCOUNTER — Ambulatory Visit: Payer: Self-pay

## 2014-01-25 ENCOUNTER — Encounter (HOSPITAL_COMMUNITY): Payer: Self-pay | Admitting: Emergency Medicine

## 2014-01-25 DIAGNOSIS — R440 Auditory hallucinations: Secondary | ICD-10-CM | POA: Insufficient documentation

## 2014-01-25 DIAGNOSIS — Z8742 Personal history of other diseases of the female genital tract: Secondary | ICD-10-CM | POA: Insufficient documentation

## 2014-01-25 DIAGNOSIS — G43909 Migraine, unspecified, not intractable, without status migrainosus: Secondary | ICD-10-CM | POA: Insufficient documentation

## 2014-01-25 DIAGNOSIS — Z79899 Other long term (current) drug therapy: Secondary | ICD-10-CM | POA: Insufficient documentation

## 2014-01-25 DIAGNOSIS — E039 Hypothyroidism, unspecified: Secondary | ICD-10-CM | POA: Insufficient documentation

## 2014-01-25 DIAGNOSIS — Z792 Long term (current) use of antibiotics: Secondary | ICD-10-CM | POA: Insufficient documentation

## 2014-01-25 DIAGNOSIS — I10 Essential (primary) hypertension: Secondary | ICD-10-CM | POA: Insufficient documentation

## 2014-01-25 DIAGNOSIS — J45909 Unspecified asthma, uncomplicated: Secondary | ICD-10-CM | POA: Insufficient documentation

## 2014-01-25 DIAGNOSIS — E669 Obesity, unspecified: Secondary | ICD-10-CM | POA: Insufficient documentation

## 2014-01-25 DIAGNOSIS — E119 Type 2 diabetes mellitus without complications: Secondary | ICD-10-CM | POA: Insufficient documentation

## 2014-01-25 LAB — CBC WITH DIFFERENTIAL/PLATELET
BASOS ABS: 0 10*3/uL (ref 0.0–0.1)
BASOS PCT: 0 % (ref 0–1)
EOS ABS: 0.1 10*3/uL (ref 0.0–0.7)
Eosinophils Relative: 1 % (ref 0–5)
HCT: 39.3 % (ref 36.0–46.0)
HEMOGLOBIN: 13 g/dL (ref 12.0–15.0)
Lymphocytes Relative: 16 % (ref 12–46)
Lymphs Abs: 1.5 10*3/uL (ref 0.7–4.0)
MCH: 28.8 pg (ref 26.0–34.0)
MCHC: 33.1 g/dL (ref 30.0–36.0)
MCV: 86.9 fL (ref 78.0–100.0)
MONOS PCT: 5 % (ref 3–12)
Monocytes Absolute: 0.4 10*3/uL (ref 0.1–1.0)
Neutro Abs: 7 10*3/uL (ref 1.7–7.7)
Neutrophils Relative %: 78 % — ABNORMAL HIGH (ref 43–77)
Platelets: 345 10*3/uL (ref 150–400)
RBC: 4.52 MIL/uL (ref 3.87–5.11)
RDW: 12.4 % (ref 11.5–15.5)
WBC: 8.9 10*3/uL (ref 4.0–10.5)

## 2014-01-25 LAB — COMPREHENSIVE METABOLIC PANEL
ALBUMIN: 4 g/dL (ref 3.5–5.2)
ALK PHOS: 47 U/L (ref 39–117)
ALT: 12 U/L (ref 0–35)
AST: 19 U/L (ref 0–37)
Anion gap: 14 (ref 5–15)
BUN: 10 mg/dL (ref 6–23)
CO2: 24 mEq/L (ref 19–32)
CREATININE: 0.87 mg/dL (ref 0.50–1.10)
Calcium: 9.7 mg/dL (ref 8.4–10.5)
Chloride: 100 mEq/L (ref 96–112)
GFR calc Af Amer: >90 mL/min (ref >90)
GFR calc non Af Amer: >90 mL/min (ref >90)
Glucose, Bld: 98 mg/dL (ref 70–99)
POTASSIUM: 3.7 meq/L (ref 3.7–5.3)
Sodium: 138 mEq/L (ref 137–147)
TOTAL PROTEIN: 8.7 g/dL — AB (ref 6.0–8.3)
Total Bilirubin: <0.2 mg/dL — ABNORMAL LOW (ref 0.3–1.2)

## 2014-01-25 LAB — ETHANOL: Alcohol, Ethyl (B): <11 mg/dL (ref 0–11)

## 2014-01-25 NOTE — ED Notes (Signed)
Pt. reports panic attacks with auditory hallucinations , feeling angry / unable to focus while at work onset last night , denies suicidal ideation .

## 2014-01-25 NOTE — ED Notes (Signed)
SECURITY WANDED PT. AT TRIAGE ., PAPER  SCRUBS GIVEN TO PT., CLOTHES BAGGED , LABELLED AND GIVEN TO FATHER AT TRIAGE .

## 2014-01-26 ENCOUNTER — Emergency Department (HOSPITAL_COMMUNITY)
Admission: EM | Admit: 2014-01-26 | Discharge: 2014-01-26 | Disposition: A | Discharge status: Home / Self Care | Payer: Self-pay | Attending: Emergency Medicine | Admitting: Emergency Medicine

## 2014-01-26 DIAGNOSIS — E039 Hypothyroidism, unspecified: Secondary | ICD-10-CM

## 2014-01-26 DIAGNOSIS — R440 Auditory hallucinations: Secondary | ICD-10-CM

## 2014-01-26 LAB — URINALYSIS, ROUTINE W REFLEX MICROSCOPIC
Bilirubin Urine: NEGATIVE
Glucose, UA: NEGATIVE mg/dL
Hgb urine dipstick: NEGATIVE
KETONES UR: NEGATIVE mg/dL
LEUKOCYTES UA: NEGATIVE
NITRITE: NEGATIVE
PROTEIN: NEGATIVE mg/dL
Specific Gravity, Urine: 1.03 (ref 1.005–1.030)
UROBILINOGEN UA: 0.2 mg/dL (ref 0.0–1.0)
pH: 6 (ref 5.0–8.0)

## 2014-01-26 LAB — URINE MICROSCOPIC-ADD ON

## 2014-01-26 LAB — RAPID URINE DRUG SCREEN, HOSP PERFORMED
AMPHETAMINES: NOT DETECTED
Barbiturates: NOT DETECTED
Benzodiazepines: NOT DETECTED
Cocaine: NOT DETECTED
OPIATES: NOT DETECTED
Tetrahydrocannabinol: NOT DETECTED

## 2014-01-26 LAB — T4, FREE: FREE T4: 1.14 ng/dL (ref 0.80–1.80)

## 2014-01-26 LAB — TSH: TSH: 1.6 u[IU]/mL (ref 0.350–4.500)

## 2014-01-26 LAB — CBG MONITORING, ED: GLUCOSE-CAPILLARY: 97 mg/dL (ref 70–99)

## 2014-01-26 MED ORDER — IBUPROFEN 400 MG PO TABS: 600.0000 mg | ORAL_TABLET | Freq: Three times a day (TID) | ORAL | Status: DC | PRN

## 2014-01-26 MED ORDER — LEVOTHYROXINE SODIUM 25 MCG PO TABS
25.0000 ug | ORAL_TABLET | Freq: Every day | ORAL | Status: DC
  Administered 2014-01-26: 25 ug via ORAL
  Filled 2014-01-26 (×2): qty 1

## 2014-01-26 MED ORDER — LORAZEPAM 1 MG PO TABS
0.5000 mg | ORAL_TABLET | Freq: Once | ORAL | Status: AC
  Administered 2014-01-26: 0.5 mg via ORAL
  Filled 2014-01-26: qty 1

## 2014-01-26 MED ORDER — ALBUTEROL SULFATE HFA 108 (90 BASE) MCG/ACT IN AERS: 1.0000 | INHALATION_SPRAY | Freq: Four times a day (QID) | RESPIRATORY_TRACT | Status: DC | PRN

## 2014-01-26 MED ORDER — NICOTINE 21 MG/24HR TD PT24: 21.0000 mg | MEDICATED_PATCH | Freq: Every day | TRANSDERMAL | Status: DC

## 2014-01-26 MED ORDER — METFORMIN HCL 500 MG PO TABS
500.0000 mg | ORAL_TABLET | Freq: Two times a day (BID) | ORAL | Status: DC
  Administered 2014-01-26: 500 mg via ORAL
  Filled 2014-01-26: qty 1

## 2014-01-26 MED ORDER — ALUM & MAG HYDROXIDE-SIMETH 200-200-20 MG/5ML PO SUSP: 30.0000 mL | ORAL | Status: DC | PRN

## 2014-01-26 MED ORDER — ONDANSETRON HCL 4 MG PO TABS: 4.0000 mg | ORAL_TABLET | Freq: Three times a day (TID) | ORAL | Status: DC | PRN

## 2014-01-26 MED ORDER — LORAZEPAM 1 MG PO TABS: 1.0000 mg | ORAL_TABLET | Freq: Three times a day (TID) | ORAL | Status: DC | PRN

## 2014-01-26 MED ORDER — IBUPROFEN 800 MG PO TABS
800.0000 mg | ORAL_TABLET | Freq: Once | ORAL | Status: AC
  Administered 2014-01-26: 800 mg via ORAL
  Filled 2014-01-26: qty 1

## 2014-01-26 MED ORDER — ZOLPIDEM TARTRATE 5 MG PO TABS: 5.0000 mg | ORAL_TABLET | Freq: Every evening | ORAL | Status: DC | PRN

## 2014-01-26 NOTE — ED Notes (Signed)
[phone] [phone]([place])

## 2014-01-26 NOTE — Discharge Instructions (Signed)
°Emergency Department Resource Guide °1) Find a Doctor and Pay Out of Pocket °Although you won't have to find out who is covered by your insurance plan, it is a good idea to ask around and get recommendations. You will then need to call the office and see if the doctor you have chosen will accept you as a new patient and what types of options they offer for patients who are self-pay. Some doctors offer discounts or will set up payment plans for their patients who do not have insurance, but you will need to ask so you aren't surprised when you get to your appointment. ° °2) Contact Your Local Health Department °Not all health departments have doctors that can see patients for sick visits, but many do, so it is worth a call to see if yours does. If you don't know where your local health department is, you can check in your phone book. The CDC also has a tool to help you locate your state's health department, and many state websites also have listings of all of their local health departments. ° °3) Find a Walk-in Clinic °If your illness is not likely to be very severe or complicated, you may want to try a walk in clinic. These are popping up all over the country in pharmacies, drugstores, and shopping centers. They're usually staffed by nurse practitioners or physician assistants that have been trained to treat common illnesses and complaints. They're usually fairly quick and inexpensive. However, if you have serious medical issues or chronic medical problems, these are probably not your best option. ° °No Primary Care Doctor: °- Call [facility] at  832-8000 - they can help you locate a primary care doctor that  accepts your insurance, provides certain services, etc. °- Physician Referral Service- 1-800-533-3463 ° °Chronic Pain Problems: °Organization         Address  Phone   Notes  °[facility]  (336) 297-2271 Patients need to be referred by their primary care doctor.  ° °Medication  Assistance: °Organization         Address  Phone   Notes  °[place] [facility] [address] °[place], [place] [address] (336) 641-8030 --Must be a resident of [place] °-- Must have NO insurance coverage whatsoever (no Medicaid/ Medicare, etc.) °-- The pt. MUST have a primary care doctor that directs their care regularly and follows them in the community °  °[facility]  (866) 331-1348   °[employer]  (888) 892-1162   ° °Agencies that provide inexpensive medical care: °Organization         Address  Phone   Notes  °[facility]  (336) 832-8035   °[facility]    (336) 832-7272   °[facility] [address] °[place], [place] [address] (336) 832-4777   °[facility] [address], °[place] (336) 271-4999   °[facility]    (336) 373-0678   °[facility]    (336) 272-1050   °[facility] ° [address], [place] Phone:  (336) 832-4444, Fax:  (336) 832-4440 Hours of Operation:  9 am - 6 pm, M-F.  Also accepts Medicaid/Medicare and self-pay.  °[facility] ° [address],  Phone: (336) [phone], Fax: (336) [phone]. Hours of Operation:  8:30 am - 5:30 pm, M-F.  Also accepts Medicaid and self-pay.  °[facility] [place] [address], [place] Phone: (336) 878-6027   °[facility] [address], [place], [place] (336)723-1848, Ext. 123 Mondays & Thursdays: 7-9 AM.  First 15 patients are seen on a first come, first serve basis. °  ° °Medicaid-accepting [place] Providers: ° °Organization         Address  Phone   Notes  °[facility] [address], [place] (336) 641-2100 Also accepts self-pay patients.  °[facility] [address], [place] ° (336) 856-9996   °[facility] [address], [place]  (336) 288-8857   °[facility] [address] [place] [address], [place] (336) 299-7000   °Veita Bland [address], [place]  ° (336) 373-1557 Only accepts [place] Access Medicaid patients after they have their name applied to their card.  ° °Self-Pay (no insurance) in [place]: ° °Organization         Address  Phone   Notes  °Sickle Cell Patients, [facility] [address], [place] (336) 832-1970   °[facility] [address], [place] (336) 832-4400   °[facility] ° [address], [place] (336) 992-4800   °[facility]/Dr. Osei-Bonsu ° [address] [place] [address], [place] or [address], [place] (336) 841-8500 Phone number for both [place] and [place] locations is the same.  °[facility] [address], [place] (336) 299-0000   °[facility] [address] [place] [address], [place] or [address] (336) 852-7530 °(336) 878-2260   °[facility] [address], [place] (336) 350-1642, phone; (336) 294-5005, fax Sees patients 1st and 3rd Saturday of every month.  Must not qualify for public or private insurance (i.e. Medicaid, Medicare, [place] Health Choice, Veterans' Benefits) • Household income should be no more than 200% of the poverty level •The clinic cannot treat you if you are pregnant or think you are pregnant • Sexually transmitted diseases are not treated at the clinic.  ° ° °Dental Care: °Organization         Address  Phone  Notes  °[place] [facility] [address], [place] (336) 641-6152 Accepts children up to age 21 who are enrolled in Medicaid or [place] Health Choice; pregnant women with a Medicaid card; and children who have applied for Medicaid or [place] Health Choice, but were declined, whose parents can pay a reduced fee at time of service.  °[place]  [facility] [place]  [address], [place] (336) 641-7733 Accepts children up to age 21 who are enrolled in Medicaid or [place] Health Choice; pregnant women with a Medicaid card; and children who have applied for Medicaid or [place] Health Choice, but were declined, whose parents can pay a reduced fee at time of service.  °[facility] ° [address], [place] (336) 641-4533 Patients are seen by appointment only. Walk-ins are not accepted. [facility] will see patients 18 years of age and older. °Monday - Tuesday (8am-5pm) °Most Wednesdays (8:30-5pm) °$30 per visit, cash only  °[facility] ° [address], [place] (336) 641-4533 Patients are seen by appointment only. Walk-ins are not accepted. [facility] will see patients 18 years of age and older. °One   Wednesday Evening (Monthly: Volunteer Based).  $30 per visit, cash only  °[facility]  (919) 537-3737 for adults; Children under age 4, call Graduate Pediatric Dentistry at (919) [phone]. Children aged 4-14, please call (919) [phone] to request a pediatric application. ° Dental services are provided in all areas of dental care including fillings, crowns and bridges, complete and partial dentures, implants, gum treatment, root canals, and extractions. Preventive care is also provided. Treatment is provided to both adults and children. °Patients are selected via a lottery and there is often a waiting list. °  °[facility] [address], °[place] ° (336) 763-8833 [email or web address] °  °[facility] [address], [place], [place] (336)723-1848, Ext. 123 Second and Fourth Thursday of each month, opens at 6:30 AM; Clinic ends at 9 AM.  Patients are seen on a first-come first-served basis, and a limited number are seen during each clinic.  ° °[facility] ° [address], [place], [place] (336) 723-7904    Eligibility Requirements °You must have lived in [place], [place], or [place] counties for at least the last three months. °  You cannot be eligible for state or federal sponsored healthcare insurance, including Veterans Administration, Medicaid, or Medicare. °  You generally cannot be eligible for healthcare insurance through your employer.  °  How to apply: °Eligibility screenings are held every Tuesday and Wednesday afternoon from 1:00 pm until 4:00 pm. You do not need an appointment for the interview!  °[facility] [address], [place], [place] 336-631-2330   °[facility] [facility]  336-342-8273   °[facility]  336-703-3100   ° [facility]  336-570-6415   ° °Behavioral Health Resources in the Community: °Intensive Outpatient Programs °Organization         Address  Phone  Notes  °[facility] [address], [place], [place] 336-878-6098   °[facility] [address], [place], [place] 336-832-9800   °[facility] [address], [place], [place] ° 336-882-2125   °[facility] [address],  °[place], [place] 1-800-853-5163 or 336-641-4981   °Substance Abuse Resources °Organization         Address  Phone  Notes  °[facility]  336-882-2125   °[facility]  336-784-9470   °[facility]  336-285-9073   °[facility]  336-845-3988   °Residential & Outpatient Substance Abuse Program  1-800-659-3381   °Psychological Services °Organization         Address  Phone  Notes  °[facility]  336- 832-9600   °[facility]  336- 378-7881   °[facility] [address], [place] 1-800-853-5163 or 336-641-4981   ° °Mobile Crisis Teams °Organization         Address  Phone  Notes  °[facility], Mobile Crisis Care Unit  1-877-626-1772   °Assertive °[facility] ° [address]  [place], [place] 336-834-9664   °Sharon DeEsch [address] °[place] 336-554-5454   ° °Self-Help/Support Groups °Organization         Address  Phone             Notes  °[facility] [place] - variety of support groups  336- 373-1402 Call for more information  °Narcotics Anonymous (NA), [facility] [address], °[place]  2 meetings at this location  ° °  Residential Treatment Programs °Organization         Address  Phone  Notes  °[facility] [address],    °[place]  1-866-801-8205   °[facility] ° [address], [place], [place] 704-293-8524   °[facility] [address], [place] 336-845-3988 Admissions: 8am-3pm M-F  °[facility] [address]    °[place], [place] 336-841-1104   °[facility] [address], [place], [place] 336-379-7146   °[facility] [address]  °[place], [place] 336-285-9073   °[facility] Intensive Outpatient [address], [place], [place] 336-852-3033   °[facility] [address]  °[place], [place] 1-877-615-2722 or 336-784-9470   °[facility] [address] [place], [place] 336-227-7417 Accepts Medicaid  °[facility] [address]  °[place] 1-800-659-3381 Substance Abuse/Addiction Treatment  ° °[place] Behavioral Health Resources °Organization         Address  Phone  Notes  °[facility]  (888) 581-9988   °Julie Brannon, PhD [address] [place], [place]   (336) 349-5553 or (336) 951-0000   °[facility]   [address] °[place], [place] (336) 349-4454   °[facility] [address], [place], [place] (336) 342-8316 Insurance/Medicaid/sponsorship through [facility]  °[facility] [address]                                    [place], [place] (336) 342-8316 Therapy/tele-psych/case    °[facility] [address]  ° , [place] (336) 349-2233    °Dr. Arfeen  (336) 349-4544   °[facility] [place]  [employer] [place] [facility] 1) [address],  °2) [address], [place] °3)  [address], [place] (336) 349-3220 °(336) 342-7768 ° °(336) 342-8140   °[place] [facility] (336) 342-1394 or (336) 342-3537 (After Hours)    ° ° °

## 2014-01-26 NOTE — ED Notes (Signed)
Pt resting quietly at the time. No signs of distress noted. Family at bedside.

## 2014-01-26 NOTE — ED Notes (Signed)
BH at bedside to evaluate.

## 2014-01-26 NOTE — ED Provider Notes (Signed)
CSN: [id number]     Arrival date & time 01/25/14  2229 History   First MD Initiated Contact with Patient 01/26/14 0027     Chief Complaint  Patient presents with  . Panic Attack  . Hallucinations     (Consider location/radiation/quality/duration/timing/severity/associated sxs/prior Treatment) The history is provided by the patient and medical records. No language interpreter was used.     Ashlee Herman is a 23 y.o. female  with a hx of NIDDM, HTN, Hashimoto's thyroiditis, hypothyroidism, migraine headache and obesity presents to the Emergency Department complaining of emotional lability, inattention and auditory hallucinations onset yesterday evening. Patient reports that last night she had a panic attack which is very unusual for her as she has no history of these. Patient reports that afterwards she kept hearing herself talk to herself. She reports that the voices in her head or telling her that she was crazy and that she was bipolar just like her mother. Patient reports multiple panic attacks since yesterday evening, several through the night and more today. She denies visual hallucinations. She also denies suicidal or homicidal ideations. She reports that last night she was angry and felt like hurting someone the head no 1 in particular that she was thinking of and had no plan for hurting someone specific. Patient reports she has not checked her blood sugar in a long time but she does take metformin for it. Patient also reports that she has been without her thyroid medication for several weeks.  Patient's father is at bedside and reports that her mother is bipolar and his brother has schizophrenia.   Past Medical History  Diagnosis Date  . Obesity   . Goiter   . Hypertension   . Thyroiditis, autoimmune   . Dyspepsia   . Hypothyroid   . Fainting episodes   . Diabetes mellitus   . Asthma 11/2009  . H/O: menorrhagia 11/2009  . H/O dysmenorrhea 11/2009  . Thyroid activity decreased     Past Surgical History  Procedure Laterality Date  . Tympanostomy tube placement    . Cleft palate repair     Family History  Problem Relation Age of Onset  . Obesity Mother   . Hypertension Mother   . Obesity Father   . Cancer Maternal Grandmother   . Thyroid disease Neg Hx    History  Substance Use Topics  . Smoking status: Never Smoker   . Smokeless tobacco: Never Used  . Alcohol Use: No   OB History    No data available     Review of Systems  Constitutional: Negative for fever, diaphoresis, appetite change, fatigue and unexpected weight change.  HENT: Negative for mouth sores.   Eyes: Negative for visual disturbance.  Respiratory: Negative for cough, chest tightness, shortness of breath and wheezing.   Cardiovascular: Negative for chest pain.  Gastrointestinal: Negative for nausea, vomiting, abdominal pain, diarrhea and constipation.  Endocrine: Negative for polydipsia, polyphagia and polyuria.  Genitourinary: Negative for dysuria, urgency, frequency and hematuria.  Musculoskeletal: Negative for back pain and neck stiffness.  Skin: Negative for rash.  Allergic/Immunologic: Negative for immunocompromised state.  Neurological: Negative for syncope, light-headedness and headaches.  Hematological: Does not bruise/bleed easily.  Psychiatric/Behavioral: Positive for hallucinations and decreased concentration. Negative for sleep disturbance. The patient is nervous/anxious.       Allergies  Review of patient's allergies indicates no known allergies.  Home Medications   Prior to Admission medications   Medication Sig Start Date End Date Taking? Authorizing Provider  albuterol (PROVENTIL HFA;VENTOLIN HFA) 108 (90 BASE) MCG/ACT inhaler Inhale into the lungs every 6 (six) hours as needed for wheezing or shortness of breath.   Yes Historical Provider, MD  albuterol (PROVENTIL) (2.5 MG/3ML) 0.083% nebulizer solution Take 3 mLs (2.5 mg total) by nebulization every 6 (six)  hours as needed for wheezing. 12/25/11  Yes Nathan R. Pickering, MD  ibuprofen (ADVIL,MOTRIN) 200 MG tablet Take 800 mg by mouth every 6 (six) hours as needed for pain.    Yes Historical Provider, MD  levothyroxine (SYNTHROID, LEVOTHROID) 25 MCG tablet Take 25 mcg by mouth daily. Brand Name Synthroid only 12/14/11  Yes David StallMichael J Brennan, MD  metFORMIN (GLUCOPHAGE) 500 MG tablet Take 1 tablet (500 mg total) by mouth 2 (two) times daily with a meal. 09/18/11  Yes Dessa PhiJennifer Badik, MD  ACCU-CHEK FASTCLIX LANCETS MISC 102 each by Does not apply route 2 times daily at 12 noon and 4 pm. Check sugar 10 x daily 02/01/11   David StallMichael J Brennan, MD  cephALEXin (KEFLEX) 500 MG capsule Take 1 capsule (500 mg total) by mouth 4 (four) times daily. Patient not taking: Reported on 01/26/2014 11/28/12   Santiago GladHeather Laisure, PA-C  ondansetron (ZOFRAN) 4 MG tablet Take 1 tablet (4 mg total) by mouth every 6 (six) hours. 12/03/12   Hayden Rasmussenavid Mabe, NP  sulfamethoxazole-trimethoprim (SEPTRA DS) 800-160 MG per tablet Take 2 tablets by mouth 2 (two) times daily. Patient not taking: Reported on 01/26/2014 11/28/12   Santiago GladHeather Laisure, PA-C  SUMAtriptan (IMITREX) 100 MG tablet Take 1 tablet (100 mg total) by mouth every 2 (two) hours as needed for migraine. May repeat in 2 hours if headache persists or recurs. MAXIMUM 2 DOSES IN 24 HOURS 12/05/12   Shari A Upstill, PA-C   BP 122/70 mmHg  Pulse 71  Temp(Src) 98.2 F (36.8 C) (Oral)  Resp 14  Ht 5\' 5"  (1.651 m)  Wt 237 lb (107.502 kg)  BMI 39.44 kg/m2  SpO2 100%  LMP 12/28/2013 Physical Exam  Constitutional: She appears well-developed and well-nourished. No distress.  Awake, alert, nontoxic appearance  HENT:  Head: Normocephalic and atraumatic.  Mouth/Throat: Oropharynx is clear and moist. No oropharyngeal exudate.  Eyes: Conjunctivae are normal. No scleral icterus.  Neck: Normal range of motion. Neck supple.  Cardiovascular: Normal rate, regular rhythm, normal heart sounds and  intact distal pulses.   No murmur heard. No tachycardia  Pulmonary/Chest: Effort normal and breath sounds normal. No respiratory distress. She has no wheezes.  Equal chest expansion  Abdominal: Soft. Bowel sounds are normal. She exhibits no mass. There is no tenderness. There is no rebound and no guarding.  Abdomen soft and nontender  Musculoskeletal: Normal range of motion. She exhibits no edema.  No peripheral edema  Neurological: She is alert.  Speech is clear and goal oriented Moves extremities without ataxia  Skin: Skin is warm and dry. She is not diaphoretic.  No myxedema  Psychiatric: Her mood appears anxious. She is actively hallucinating. She expresses no homicidal and no suicidal ideation. She expresses no suicidal plans and no homicidal plans.  Patient is tearful, stating that she is afraid  Nursing note and vitals reviewed.   ED Course  Procedures (including critical care time) Labs Review Labs Reviewed  CBC WITH DIFFERENTIAL - Abnormal; Notable for the following:    Neutrophils Relative % 78 (*)    All other components within normal limits  COMPREHENSIVE METABOLIC PANEL - Abnormal; Notable for the following:    Total Protein 8.7 (*)  Total Bilirubin <0.2 (*)    All other components within normal limits  ETHANOL  TSH  URINE RAPID DRUG SCREEN (HOSP PERFORMED)  T4, FREE  URINALYSIS, ROUTINE W REFLEX MICROSCOPIC  CBG MONITORING, ED    Imaging Review No results found.   EKG Interpretation None      MDM   Final diagnoses:  Auditory hallucinations  Hypothyroidism, unspecified hypothyroidism type   Sharlot Gowdaeresa E [place] patient has family history of bipolar and schizophrenia but patient is ever been diagnosed with same. Patient with sudden onset panic attacks last night and auditory hallucinations with labile emotions and inability to focus. Patient denies suicidal or homicidal ideations.  CBC and CMP unremarkable. UA pending. Patient has been off her thyroid  medication for a minimal of one week. Concern for possible psychosis secondary to hypothyroidism. Will check TSH and free T4 before patient is cleared for psychiatric evaluation.  2:13 AM Labs reassuring and TSH level normal. Less likely hypothyroid psychosis. Patient discussed with Dr. Norlene Campbellotter. Patient will be placed in psych holding for TTS evaluation.  BP 122/70 mmHg  Pulse 71  Temp(Src) 98.2 F (36.8 C) (Oral)  Resp 14  Ht 5\' 5"  (1.651 m)  Wt 237 lb (107.502 kg)  BMI 39.44 kg/m2  SpO2 100%  LMP 12/28/2013   Dierdre ForthHannah Laith Antonelli, PA-C 01/26/14 0214  Olivia Mackielga M Otter, MD 01/26/14 [phone]

## 2014-01-26 NOTE — BH Assessment (Signed)
Assessment Note  Ashlee Herman is an 23 y.o. female.  -Clinician reviewed note by Dahlia ClientHannah Mutherspaugh,PA regarding need for TTS.  Patient came in with anxiety regarding panic attacks today and yesterday.  Patient said that she has not been having auditory hallucinations.  She said that she has been having racing thoughts and anxiety attacks.  First anxiety attack was Sunday night at home with no obvious precedent.  She got to thinking about it and said that she had been thinking about whether there was any money to buy Thanksgiving dinner for her & parents.  Patient had another panic attack tonight but that was attributable to the work load at work.  Patient denies any SI, HI or A/V hallucinations.  She did have some counseling about two years ago through [facility].  Pt feels safe to go home and evidences no lethality.  -Clinician talked to Donell SievertSpencer Simon, PA who recommended patient be discharged home with outpatient resources.  [facility].  Patient was given resources for reduced therapy rates and will be discharged home.  Axis I: Anxiety Disorder NOS Axis II: Deferred Axis III:  Past Medical History  Diagnosis Date  . Obesity   . Goiter   . Hypertension   . Thyroiditis, autoimmune   . Dyspepsia   . Hypothyroid   . Fainting episodes   . Diabetes mellitus   . Asthma 11/2009  . H/O: menorrhagia 11/2009  . H/O dysmenorrhea 11/2009  . Thyroid activity decreased    Axis IV: problems with access to health care services Axis V: 61-70 mild symptoms  Past Medical History:  Past Medical History  Diagnosis Date  . Obesity   . Goiter   . Hypertension   . Thyroiditis, autoimmune   . Dyspepsia   . Hypothyroid   . Fainting episodes   . Diabetes mellitus   . Asthma 11/2009  . H/O: menorrhagia 11/2009  . H/O dysmenorrhea 11/2009  . Thyroid activity decreased     Past Surgical History  Procedure Laterality Date  . Tympanostomy  tube placement    . Cleft palate repair      Family History:  Family History  Problem Relation Age of Onset  . Obesity Mother   . Hypertension Mother   . Obesity Father   . Cancer Maternal Grandmother   . Thyroid disease Neg Hx     Social History:  reports that she has never smoked. She has never used smokeless tobacco. She reports that she does not drink alcohol or use illicit drugs.  Additional Social History:  Alcohol / Drug Use Pain Medications: See PTA medication list Prescriptions: See PTA medication list Over the Counter: See PTA medication list History of alcohol / drug use?: No history of alcohol / drug abuse  CIWA: CIWA-Ar BP: 118/62 mmHg Pulse Rate: 69 COWS:    Allergies: No Known Allergies  Home Medications:  (Not in a hospital admission)  OB/GYN Status:  Patient's last menstrual period was 12/28/2013.  General Assessment Data [facility] Is this a Tele or Face-to-Face Assessment?: Face-to-Face Is this an Initial Assessment or a Re-assessment for this encounter?: Initial Assessment Living Arrangements: Parent (Pt lives with parents) Can pt return to current living arrangement?: Yes Admission Status: Voluntary Is patient capable of signing voluntary admission?: Yes Transfer from: Acute Hospital Referral Source: Self/Family/Friend     [facility] Living Arrangements: Parent (Pt lives with parents) Name of Psychiatrist: None Name  of Therapist: NOne     Risk to self with the past 6 months Suicidal Ideation: No Suicidal Intent: No Is patient at risk for suicide?: No Suicidal Plan?: No Access to Means: No What has been your use of drugs/alcohol within the last 12 months?: Pt denies use Previous Attempts/Gestures: No How many times?: 0 Other Self Harm Risks: None Triggers for Past Attempts: None known Intentional Self Injurious Behavior: None Family Suicide History: No Recent stressful life event(s): Financial  Problems, Other (Comment) (Taking care of parents & financial problems.) Persecutory voices/beliefs?: No Depression: Yes Depression Symptoms: Isolating Substance abuse history and/or treatment for substance abuse?: No Suicide prevention information given to non-admitted patients: Not applicable  Risk to Others within the past 6 months Homicidal Ideation: No Thoughts of Harm to Others: No Current Homicidal Intent: No Current Homicidal Plan: No Access to Homicidal Means: No Identified Victim: No one History of harm to others?: No Assessment of Violence: None Noted Violent Behavior Description: Pt calm and cooperative Does patient have access to weapons?: No Criminal Charges Pending?: No Does patient have a court date: No  Psychosis Hallucinations: None noted Delusions: None noted  Mental Status Report Appear/Hygiene: Unremarkable, In scrubs Eye Contact: Good Motor Activity: Freedom of movement, Unremarkable Speech: Logical/coherent Level of Consciousness: Quiet/awake Mood: Anxious Affect: Anxious Anxiety Level: Minimal Thought Processes: Coherent, Relevant Judgement: Unimpaired Orientation: Person, Place, Time, Situation Obsessive Compulsive Thoughts/Behaviors: None  Cognitive Functioning Concentration: Decreased Memory: Recent Intact, Remote Intact IQ: Average Insight: Good Impulse Control: Good Appetite: Good Weight Loss: 0 Weight Gain: 0 Sleep: No Change Total Hours of Sleep: 8 (<6H/D) Vegetative Symptoms: None  [facility] Patient's cognitive ability adequate to safely complete daily activities?: Yes Patient able to express need for assistance with ADLs?: Yes Independently performs ADLs?: Yes (appropriate for developmental age)  Prior Inpatient Therapy Prior Inpatient Therapy: No Prior Therapy Dates: N/A Prior Therapy Facilty/Provider(s): N/A Reason for Treatment: N/A  Prior Outpatient Therapy Prior Outpatient Therapy:  Yes Prior Therapy Dates: Two years ago Prior Therapy Facilty/Provider(s): [facility] counseling Reason for Treatment: Anxiety / Depression  ADL Screening (condition at time of admission) Patient's cognitive ability adequate to safely complete daily activities?: Yes Is the patient deaf or have difficulty hearing?: No Does the patient have difficulty seeing, even when wearing glasses/contacts?: No Does the patient have difficulty concentrating, remembering, or making decisions?: Yes Patient able to express need for assistance with ADLs?: Yes Does the patient have difficulty dressing or bathing?: No Independently performs ADLs?: Yes (appropriate for developmental age) Does the patient have difficulty walking or climbing stairs?: No Weakness of Legs: None Weakness of Arms/Hands: None       Abuse/Neglect Assessment (Assessment to be complete while patient is alone) Physical Abuse: Denies Verbal Abuse: Denies Sexual Abuse: Denies Exploitation of patient/patient's resources: Denies Self-Neglect: Denies     Merchant navy officerAdvance Directives (For Healthcare) Does patient have an advance directive?: No Would patient like information on creating an advanced directive?: No - patient declined information    Additional Information 1:1 In Past 12 Months?: No CIRT Risk: No Elopement Risk: No Does patient have medical clearance?: Yes     Disposition:  Disposition Initial Assessment Completed for this Encounter: Yes Disposition of Patient: Outpatient treatment, Referred to Type of outpatient treatment: Adult Patient referred to: Other (Comment) (Other referrals given)  On Site Evaluation by:   Reviewed with Physician:    Alexandria LodgeHarvey, Zaid Tomes Ray 01/26/2014 6:23 AM

## 2014-04-21 ENCOUNTER — Emergency Department (HOSPITAL_COMMUNITY)
Admission: EM | Admit: 2014-04-21 | Discharge: 2014-04-21 | Disposition: A | Discharge status: Home / Self Care | Payer: Self-pay | Attending: Emergency Medicine | Admitting: Emergency Medicine

## 2014-04-21 ENCOUNTER — Encounter (HOSPITAL_COMMUNITY): Payer: Self-pay | Admitting: *Deleted

## 2014-04-21 DIAGNOSIS — E669 Obesity, unspecified: Secondary | ICD-10-CM | POA: Insufficient documentation

## 2014-04-21 DIAGNOSIS — E039 Hypothyroidism, unspecified: Secondary | ICD-10-CM | POA: Insufficient documentation

## 2014-04-21 DIAGNOSIS — M25571 Pain in right ankle and joints of right foot: Secondary | ICD-10-CM | POA: Insufficient documentation

## 2014-04-21 DIAGNOSIS — I10 Essential (primary) hypertension: Secondary | ICD-10-CM | POA: Insufficient documentation

## 2014-04-21 DIAGNOSIS — Z79899 Other long term (current) drug therapy: Secondary | ICD-10-CM | POA: Insufficient documentation

## 2014-04-21 DIAGNOSIS — E119 Type 2 diabetes mellitus without complications: Secondary | ICD-10-CM | POA: Insufficient documentation

## 2014-04-21 DIAGNOSIS — Z8742 Personal history of other diseases of the female genital tract: Secondary | ICD-10-CM | POA: Insufficient documentation

## 2014-04-21 DIAGNOSIS — J45909 Unspecified asthma, uncomplicated: Secondary | ICD-10-CM | POA: Insufficient documentation

## 2014-04-21 MED ORDER — OXYCODONE-ACETAMINOPHEN 5-325 MG PO TABS
1.0000 | ORAL_TABLET | Freq: Once | ORAL | Status: AC
  Administered 2014-04-21: 1 via ORAL
  Filled 2014-04-21: qty 1

## 2014-04-21 MED ORDER — OXYCODONE-ACETAMINOPHEN 5-325 MG PO TABS: ORAL_TABLET | ORAL | Status: DC

## 2014-04-21 NOTE — ED Notes (Signed)
The pt is c/o lt heel pain for one week.  Increased pain lmp  now

## 2014-04-21 NOTE — Discharge Instructions (Signed)
Rest, Ice intermittently (in the first 24-48 hours), Gentle compression with an Ace wrap, and elevate (Limb above the level of the heart)   Take up to 800mg of ibuprofen (that is usually 4 over the counter pills)  3 times a day for 5 days. Take with food.  

## 2014-04-21 NOTE — ED Provider Notes (Signed)
CSN: [id number]     Arrival date & time 04/21/14  1837 History  This chart was scribed for Wynetta Emery, PA-C, working with Glynn Octave, MD by Chestine Spore, ED Scribe. The patient was seen in room TR06C/TR06C at 7:42 PM.    Chief Complaint  Patient presents with  . Leg Pain      The history is provided by the patient. No language interpreter was used.    HPI Comments: Ashlee Herman is a 24 y.o. female who presents to the Emergency Department complaining of leg pain onset 1 week. She notes that she has had left heel pain. She rates her pain as 9.5/10. She notes that she has been having issues with her achilles since 2011. She reports that today while at work there was intense pain. She reports that she has sprained her achilles heel before. She states that she is having associated symptoms of numbness, joint swelling, muscle spasms. she reports that the spasms go from her heel to her knee.  She states that she has tried Ibuprofen with no relief for her symptoms. She denies calf pain, and any other symptoms. She denies any allergies to medications. She denies any trauma. She notes that she does have an orthopedist at [facility]. 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   Past Medical History  Diagnosis Date  . Obesity   . Goiter   . Hypertension   . Thyroiditis, autoimmune   . Dyspepsia   . Hypothyroid   . Fainting episodes   . Diabetes mellitus   . Asthma 11/2009  . H/O: menorrhagia 11/2009  . H/O dysmenorrhea 11/2009  . Thyroid activity decreased    Past Surgical History  Procedure Laterality Date  . Tympanostomy tube placement    . Cleft palate repair     Family History  Problem Relation Age of Onset  . Obesity Mother   . Hypertension Mother   . Obesity Father   . Cancer Maternal Grandmother   . Thyroid disease Neg Hx    History  Substance Use Topics  . Smoking status: Never Smoker   . Smokeless tobacco: Never Used  . Alcohol Use: No   OB History    No data  available     Review of Systems  A complete 10 system review of systems was obtained and all systems are negative except as noted in the HPI and PMH.    Allergies  Review of patient's allergies indicates no known allergies.  Home Medications   Prior to Admission medications   Medication Sig Start Date End Date Taking? Authorizing Provider  ACCU-CHEK FASTCLIX LANCETS MISC 102 each by Does not apply route 2 times daily at 12 noon and 4 pm. Check sugar 10 x daily 02/01/11   David Stall, MD  albuterol (PROVENTIL HFA;VENTOLIN HFA) 108 (90 BASE) MCG/ACT inhaler Inhale into the lungs every 6 (six) hours as needed for wheezing or shortness of breath.    Historical Provider, MD  albuterol (PROVENTIL) (2.5 MG/3ML) 0.083% nebulizer solution Take 3 mLs (2.5 mg total) by nebulization every 6 (six) hours as needed for wheezing. 12/25/11   Juliet Rude. Pickering, MD  cephALEXin (KEFLEX) 500 MG capsule Take 1 capsule (500 mg total) by mouth 4 (four) times daily. Patient not taking: Reported on 01/26/2014 11/28/12   Santiago Glad, PA-C  ibuprofen (ADVIL,MOTRIN) 200 MG tablet Take 800 mg by mouth every 6 (six) hours as needed for pain.     Historical Provider, MD  levothyroxine (SYNTHROID, LEVOTHROID) 25 MCG tablet Take 25 mcg by mouth daily. Brand Name Synthroid only 12/14/11   David StallMichael J Brennan, MD  metFORMIN (GLUCOPHAGE) 500 MG tablet Take 1 tablet (500 mg total) by mouth 2 (two) times daily with a meal. 09/18/11   Dessa PhiJennifer Badik, MD  ondansetron (ZOFRAN) 4 MG tablet Take 1 tablet (4 mg total) by mouth every 6 (six) hours. 12/03/12   Hayden Rasmussenavid Mabe, NP  sulfamethoxazole-trimethoprim (SEPTRA DS) 800-160 MG per tablet Take 2 tablets by mouth 2 (two) times daily. Patient not taking: Reported on 01/26/2014 11/28/12   Santiago GladHeather Laisure, PA-C  SUMAtriptan (IMITREX) 100 MG tablet Take 1 tablet (100 mg total) by mouth every 2 (two) hours as needed for migraine. May repeat in 2 hours if headache persists or recurs.  MAXIMUM 2 DOSES IN 24 HOURS 12/05/12   Shari A Upstill, PA-C   BP 127/60 mmHg  Pulse 70  Temp(Src) 98 F (36.7 C) (Oral)  Resp 16  SpO2 100%  LMP 04/21/2014  Physical Exam  Constitutional: She is oriented to person, place, and time. She appears well-developed and well-nourished. No distress.  HENT:  Head: Normocephalic and atraumatic.  Eyes: EOM are normal.  Neck: Neck supple. No tracheal deviation present.  Cardiovascular: Normal rate.   Pulmonary/Chest: Effort normal and breath sounds normal. No respiratory distress.  Abdominal: Soft.  Musculoskeletal: Normal range of motion. She exhibits tenderness.  No calf pain, no calf asymmetry. Patient is exquisitely tender to palpation along the Achilles, Thompson test negative.  Neurovascular intact  Neurological: She is alert and oriented to person, place, and time.  Skin: Skin is warm and dry.  Psychiatric: She has a normal mood and affect. Her behavior is normal.  Nursing note and vitals reviewed.   ED Course  Procedures (including critical care time) DIAGNOSTIC STUDIES: Oxygen Saturation is 100% on room air, normal by my interpretation.    COORDINATION OF CARE: 7:46 PM-Discussed treatment plan which includes ibuprofen, Percocet, f/u with orthopedist with pt at bedside and pt agreed to plan.   Labs Review Labs Reviewed - No data to display  Imaging Review No results found.   EKG Interpretation None      MDM   Final diagnoses:  Right ankle pain    Filed Vitals:   04/21/14 1912 04/21/14 2027  BP: 127/60 156/75  Pulse: 70 84  Temp: 98 F (36.7 C) 97.5 F (36.4 C)  TempSrc: Oral Oral  Resp: 16 20  SpO2: 100% 100%    Medications  oxyCODONE-acetaminophen (PERCOCET/ROXICET) 5-325 MG per tablet 1 tablet (1 tablet Oral Given 04/21/14 2020)    Ashlee Gowdaeresa E Herman is a pleasant 24 y.o. female presenting with tenderness to her Achilles tendon which she's had issues within the past. Physical exam is not consistent with  a DVT. I've given the patient crutches and encouraged her to follow closely with her orthopedist.  Evaluation does not show pathology that would require ongoing emergent intervention or inpatient treatment. Pt is hemodynamically stable and mentating appropriately. Discussed findings and plan with patient/guardian, who agrees with care plan. All questions answered. Return precautions discussed and outpatient follow up given.   Discharge Medication List as of 04/21/2014  7:54 PM    START taking these medications   Details  oxyCODONE-acetaminophen (PERCOCET/ROXICET) 5-325 MG per tablet 1 to 2 tabs PO q6hrs  PRN for pain, Print           I personally performed the services described in this documentation, which was scribed  in my presence. The recorded information has been reviewed and is accurate.    Wynetta Emery, PA-C 04/22/14 1610  Glynn Octave, MD 04/22/14 [phone]

## 2014-04-21 NOTE — ED Notes (Signed)
The tech applied an ACE warp to the left lower leg and ankle. The tech has reported to the RN in charge.

## 2014-07-14 ENCOUNTER — Emergency Department (HOSPITAL_COMMUNITY): Payer: Self-pay

## 2014-07-14 ENCOUNTER — Emergency Department (HOSPITAL_COMMUNITY)
Admission: EM | Admit: 2014-07-14 | Discharge: 2014-07-14 | Disposition: A | Discharge status: Home / Self Care | Payer: Self-pay | Attending: Emergency Medicine | Admitting: Emergency Medicine

## 2014-07-14 ENCOUNTER — Encounter (HOSPITAL_COMMUNITY): Payer: Self-pay | Admitting: Emergency Medicine

## 2014-07-14 DIAGNOSIS — Z3202 Encounter for pregnancy test, result negative: Secondary | ICD-10-CM | POA: Insufficient documentation

## 2014-07-14 DIAGNOSIS — R1030 Lower abdominal pain, unspecified: Secondary | ICD-10-CM

## 2014-07-14 DIAGNOSIS — R102 Pelvic and perineal pain: Secondary | ICD-10-CM

## 2014-07-14 DIAGNOSIS — N898 Other specified noninflammatory disorders of vagina: Secondary | ICD-10-CM | POA: Insufficient documentation

## 2014-07-14 DIAGNOSIS — Z79899 Other long term (current) drug therapy: Secondary | ICD-10-CM | POA: Insufficient documentation

## 2014-07-14 DIAGNOSIS — J45909 Unspecified asthma, uncomplicated: Secondary | ICD-10-CM | POA: Insufficient documentation

## 2014-07-14 DIAGNOSIS — E039 Hypothyroidism, unspecified: Secondary | ICD-10-CM | POA: Insufficient documentation

## 2014-07-14 DIAGNOSIS — E669 Obesity, unspecified: Secondary | ICD-10-CM | POA: Insufficient documentation

## 2014-07-14 DIAGNOSIS — I10 Essential (primary) hypertension: Secondary | ICD-10-CM | POA: Insufficient documentation

## 2014-07-14 DIAGNOSIS — E119 Type 2 diabetes mellitus without complications: Secondary | ICD-10-CM | POA: Insufficient documentation

## 2014-07-14 LAB — COMPREHENSIVE METABOLIC PANEL
ALT: 15 U/L (ref 14–54)
AST: 19 U/L (ref 15–41)
Albumin: 3.9 g/dL (ref 3.5–5.0)
Alkaline Phosphatase: 42 U/L (ref 38–126)
Anion gap: 11 (ref 5–15)
BUN: 9 mg/dL (ref 6–20)
CO2: 25 mmol/L (ref 22–32)
CREATININE: 0.89 mg/dL (ref 0.44–1.00)
Calcium: 9.6 mg/dL (ref 8.9–10.3)
Chloride: 102 mmol/L (ref 101–111)
GFR calc Af Amer: >60 mL/min (ref >60)
GFR calc non Af Amer: >60 mL/min (ref >60)
GLUCOSE: 111 mg/dL — AB (ref 70–99)
Potassium: 4.2 mmol/L (ref 3.5–5.1)
Sodium: 138 mmol/L (ref 135–145)
Total Bilirubin: 0.5 mg/dL (ref 0.3–1.2)
Total Protein: 8.2 g/dL — ABNORMAL HIGH (ref 6.5–8.1)

## 2014-07-14 LAB — CBC WITH DIFFERENTIAL/PLATELET
BASOS ABS: 0 10*3/uL (ref 0.0–0.1)
BASOS PCT: 0 % (ref 0–1)
EOS ABS: 0 10*3/uL (ref 0.0–0.7)
EOS PCT: 0 % (ref 0–5)
HCT: 36.5 % (ref 36.0–46.0)
Hemoglobin: 12.2 g/dL (ref 12.0–15.0)
LYMPHS ABS: 1.6 10*3/uL (ref 0.7–4.0)
Lymphocytes Relative: 18 % (ref 12–46)
MCH: 28.8 pg (ref 26.0–34.0)
MCHC: 33.4 g/dL (ref 30.0–36.0)
MCV: 86.1 fL (ref 78.0–100.0)
Monocytes Absolute: 0.8 10*3/uL (ref 0.1–1.0)
Monocytes Relative: 8 % (ref 3–12)
NEUTROS PCT: 74 % (ref 43–77)
Neutro Abs: 6.7 10*3/uL (ref 1.7–7.7)
Platelets: 323 10*3/uL (ref 150–400)
RBC: 4.24 MIL/uL (ref 3.87–5.11)
RDW: 12.8 % (ref 11.5–15.5)
WBC: 9.1 10*3/uL (ref 4.0–10.5)

## 2014-07-14 LAB — URINALYSIS, ROUTINE W REFLEX MICROSCOPIC
Bilirubin Urine: NEGATIVE
GLUCOSE, UA: NEGATIVE mg/dL
Ketones, ur: NEGATIVE mg/dL
Leukocytes, UA: NEGATIVE
Nitrite: NEGATIVE
PROTEIN: 30 mg/dL — AB
SPECIFIC GRAVITY, URINE: 1.027 (ref 1.005–1.030)
Urobilinogen, UA: 0.2 mg/dL (ref 0.0–1.0)
pH: 6.5 (ref 5.0–8.0)

## 2014-07-14 LAB — WET PREP, GENITAL
Clue Cells Wet Prep HPF POC: NONE SEEN
Trich, Wet Prep: NONE SEEN
Yeast Wet Prep HPF POC: NONE SEEN

## 2014-07-14 LAB — URINE MICROSCOPIC-ADD ON

## 2014-07-14 LAB — POC URINE PREG, ED: PREG TEST UR: NEGATIVE

## 2014-07-14 LAB — GC/CHLAMYDIA PROBE AMP (~~LOC~~) NOT AT ARMC
CHLAMYDIA, DNA PROBE: NEGATIVE
Neisseria Gonorrhea: NEGATIVE

## 2014-07-14 LAB — LIPASE, BLOOD: Lipase: 25 U/L (ref 22–51)

## 2014-07-14 MED ORDER — HYDROCODONE-ACETAMINOPHEN 5-325 MG PO TABS: ORAL_TABLET | ORAL | Status: DC

## 2014-07-14 MED ORDER — ONDANSETRON 4 MG PO TBDP: 4.0000 mg | ORAL_TABLET | Freq: Three times a day (TID) | ORAL | Status: DC | PRN

## 2014-07-14 MED ORDER — IBUPROFEN 200 MG PO TABS
600.0000 mg | ORAL_TABLET | Freq: Once | ORAL | Status: AC
  Administered 2014-07-14: 600 mg via ORAL
  Filled 2014-07-14: qty 3

## 2014-07-14 NOTE — ED Provider Notes (Signed)
[facility]   Pt with suprapubic and RLQ pain + vaginal d/c x several days. R adnexal pain on exam. Pending pelvic US, wet prep. Work-up remarkable for hematuria, but no obvious infection. Per previous provider, low suspicion for kidney stone.   BP 115/75 mmHg  Pulse 100  Temp(Src) 98.2 F (36.8 C) (Oral)  Resp 17  Ht 5\' 6"  (1.676 m)  Wt 230 lb (104.327 kg)  BMI 37.14 kg/m2  SpO2 98%  LMP 06/27/2014  Plan: dispo per pelvic US.   7:55 AM US neg for torsion or other definite acute cause. Wet prep neg. Pt comfortable now, eating in room. Low suspicion for appendicitis, ureteral stone -- but we discussed s/s that should cause her to return. The patient was urged to return to the Emergency Department immediately with worsening of current symptoms, worsening abdominal pain, persistent vomiting, blood noted in stools, fever, or any other concerns. The patient verbalized understanding.   Will d/c to home with pain medication and nausea medication, PCP referral.   Exam:  Gen NAD; Heart RRR, nml S1,S2, no m/r/g; Lungs CTAB; Abd soft, minimal R pelvic and RLQ abd pain, no rebound or guarding, neg Rovsings; Ext 2+ pedal pulses bilaterally, no edema.  Patient counseled on use of narcotic pain medications. Counseled not to combine these medications with others containing tylenol. Urged not to drink alcohol, drive, or perform any other activities that requires focus while taking these medications. The patient verbalizes understanding and agrees with the plan.      Renne CriglerJoshua Early Steel, PA-C 07/14/14 [phone]  Marisa Severinlga Otter, MD 07/18/14 [phone]

## 2014-07-14 NOTE — ED Notes (Signed)
Pt. reports right lower abdominal pain onset yesterday and bloody vaginal discharge onset 2 days ago with diarrhea. Denies emesis / no fever or chills.

## 2014-07-14 NOTE — ED Provider Notes (Signed)
CSN: [id number]     Arrival date & time 07/14/14  0034 History   First MD Initiated Contact with Patient 07/14/14 [phone]     Chief Complaint  Patient presents with  . Abdominal Pain  . Vaginal Discharge    (Consider location/radiation/quality/duration/timing/severity/associated sxs/prior Treatment) HPI Comments: Patient is a 24 year old female with a hx of HTN, DM, and hypothyroid who presents to the emergency department for further evaluation of abdominal pain. Patient states that she had pain onset 3 days ago which was preceded by watery vaginal discharge x 2 days. She describes the pain as cramping and stabbing. It has been intermittent and without modifying factors. Patient has taken ibuprofen without relief. She noticed the pain primarily in her right lower abdomen. She reports that her vaginal discharge has persisted and become more mucousy in nature. She developed some vaginal bleeding approximately 24 hours ago. Patient denies associated fever, nausea, vomiting, diarrhea, dysuria. She has not been sexually active in the past 4 years. No history of abdominal surgeries.  Patient is a 24 y.o. female presenting with abdominal pain and vaginal discharge. The history is provided by the patient. No language interpreter was used.  Abdominal Pain Associated symptoms: vaginal bleeding and vaginal discharge   Associated symptoms: no dysuria and no hematuria   Vaginal Discharge Associated symptoms: abdominal pain   Associated symptoms: no dysuria     Past Medical History  Diagnosis Date  . Obesity   . Goiter   . Hypertension   . Thyroiditis, autoimmune   . Dyspepsia   . Hypothyroid   . Fainting episodes   . Diabetes mellitus   . Asthma 11/2009  . H/O: menorrhagia 11/2009  . H/O dysmenorrhea 11/2009  . Thyroid activity decreased    Past Surgical History  Procedure Laterality Date  . Tympanostomy tube placement    . Cleft palate repair     Family History  Problem Relation Age of Onset   . Obesity Mother   . Hypertension Mother   . Obesity Father   . Cancer Maternal Grandmother   . Thyroid disease Neg Hx    History  Substance Use Topics  . Smoking status: Never Smoker   . Smokeless tobacco: Never Used  . Alcohol Use: No   OB History    No data available      Review of Systems  Gastrointestinal: Positive for abdominal pain.  Genitourinary: Positive for vaginal bleeding and vaginal discharge. Negative for dysuria and hematuria.  All other systems reviewed and are negative.   Allergies  Review of patient's allergies indicates no known allergies.  Home Medications   Prior to Admission medications   Medication Sig Start Date End Date Taking? Authorizing Provider  albuterol (PROVENTIL HFA;VENTOLIN HFA) 108 (90 BASE) MCG/ACT inhaler Inhale into the lungs every 6 (six) hours as needed for wheezing or shortness of breath.   Yes Historical Provider, MD  albuterol (PROVENTIL) (2.5 MG/3ML) 0.083% nebulizer solution Take 3 mLs (2.5 mg total) by nebulization every 6 (six) hours as needed for wheezing. 12/25/11  Yes Benjiman CoreNathan Pickering, MD  hydrOXYzine (ATARAX/VISTARIL) 10 MG tablet Take 10-20 mg by mouth 3 (three) times daily. Take 1 tablet every morning and afternoon then take 2 tablets at night   Yes Historical Provider, MD  levothyroxine (SYNTHROID, LEVOTHROID) 25 MCG tablet Take 25 mcg by mouth daily. Brand Name Synthroid only 12/14/11  Yes David StallMichael J Brennan, MD  metFORMIN (GLUCOPHAGE) 500 MG tablet Take 1 tablet (500 mg total) by mouth 2 (two)  times daily with a meal. 09/18/11  Yes Dessa Phi, MD  oxyCODONE-acetaminophen (PERCOCET/ROXICET) 5-325 MG per tablet 1 to 2 tabs PO q6hrs  PRN for pain 04/21/14  Yes Nicole Pisciotta, PA-C  SUMAtriptan (IMITREX) 100 MG tablet Take 1 tablet (100 mg total) by mouth every 2 (two) hours as needed for migraine. May repeat in 2 hours if headache persists or recurs. MAXIMUM 2 DOSES IN 24 HOURS 12/05/12  Yes Shari Upstill, PA-C  ACCU-CHEK  FASTCLIX LANCETS MISC 102 each by Does not apply route 2 times daily at 12 noon and 4 pm. Check sugar 10 x daily 02/01/11   David Stall, MD  cephALEXin (KEFLEX) 500 MG capsule Take 1 capsule (500 mg total) by mouth 4 (four) times daily. Patient not taking: Reported on 01/26/2014 11/28/12   Santiago Glad, PA-C  ondansetron (ZOFRAN) 4 MG tablet Take 1 tablet (4 mg total) by mouth every 6 (six) hours. Patient not taking: Reported on 07/14/2014 12/03/12   Hayden Rasmussen, NP  sulfamethoxazole-trimethoprim (SEPTRA DS) 800-160 MG per tablet Take 2 tablets by mouth 2 (two) times daily. Patient not taking: Reported on 01/26/2014 11/28/12   Heather Laisure, PA-C   BP 115/75 mmHg  Pulse 100  Temp(Src) 98.2 F (36.8 C) (Oral)  Resp 17  Ht  (1.676 m)  Wt 230 lb (104.327 kg)  BMI 37.14 kg/m2  SpO2 98%  LMP 06/27/2014   Physical Exam  Constitutional: She is oriented to person, place, and time. She appears well-developed and well-nourished. No distress.  Nontoxic/nonseptic appearing  HENT:  Head: Normocephalic and atraumatic.  Eyes: Conjunctivae and EOM are normal. No scleral icterus.  Neck: Normal range of motion.  Cardiovascular: Normal rate, regular rhythm and intact distal pulses.   Pulmonary/Chest: Effort normal and breath sounds normal. No respiratory distress. She has no wheezes. She has no rales.  CTAB. Respirations even and unlabored.  Abdominal: Soft. She exhibits no distension. There is tenderness. There is no rebound and no guarding.  TTP in the suprapubic abdomen and RLQ without rebound or guarding. Obese abdomen is soft and without peritoneal signs.  Genitourinary: There is no rash, tenderness, lesion or injury on the right labia. There is no rash, tenderness, lesion or injury on the left labia. Uterus is tender. Cervix exhibits no motion tenderness and no friability. Right adnexum displays tenderness (moderate). Right adnexum displays no mass and no fullness. Left adnexum displays  no mass, no tenderness and no fullness. No bleeding in the vagina. Vaginal discharge (scant, clear) found.  Musculoskeletal: Normal range of motion.  Neurological: She is alert and oriented to person, place, and time. She exhibits normal muscle tone. Coordination normal.  Skin: Skin is warm and dry. No rash noted. She is not diaphoretic. No erythema. No pallor.  Psychiatric: She has a normal mood and affect. Her behavior is normal.  Nursing note and vitals reviewed.    ED Course  Procedures (including critical care time) Labs Review Labs Reviewed  WET PREP, GENITAL - Abnormal; Notable for the following:    WBC, Wet Prep HPF POC FEW (*)    All other components within normal limits  COMPREHENSIVE METABOLIC PANEL - Abnormal; Notable for the following:    Glucose, Bld 111 (*)    Total Protein 8.2 (*)    All other components within normal limits  URINALYSIS, ROUTINE W REFLEX MICROSCOPIC - Abnormal; Notable for the following:    Color, Urine AMBER (*)    APPearance CLOUDY (*)    Hgb  urine dipstick LARGE (*)    Protein, ur 30 (*)    All other components within normal limits  URINE MICROSCOPIC-ADD ON - Abnormal; Notable for the following:    Squamous Epithelial / LPF FEW (*)    Bacteria, UA MANY (*)    All other components within normal limits  CBC WITH DIFFERENTIAL/PLATELET  LIPASE, BLOOD  POC URINE PREG, ED  GC/CHLAMYDIA PROBE AMP ([place])   Imaging Review No results found.   EKG Interpretation None      MDM   Final diagnoses:  Lower abdominal pain    24 year old female presents to the emergency department for further evaluation of right lower abdominal pain. Physical exam with TTP in the RLQ, suprapubic abdomen, and R adnexa. No peritoneal signs. No fever, leukocytosis, or left shift. Urinalysis with many bacteria and 21-50 RBCs; 0-2 WBCs making UTI unlikely. Urine pregnancy negative.  Patient with pelvic ultrasound pending to further evaluate symptoms. She is in no  pain at present and has been resting comfortably. Patient signed out to Rhea BleacherJosh Geiple, PA-C at shift change who will follow up on imaging and disposition appropriately.   Filed Vitals:   07/14/14 0037 07/14/14 0423  BP: 146/84 115/75  Pulse: 114 100  Temp: 98.3 F (36.8 C) 98.2 F (36.8 C)  TempSrc: Oral Oral  Resp: 16 17  Height: 5\' 6"  (1.676 m)   Weight: 230 lb (104.327 kg)   SpO2: 96% 98%     Antony MaduraKelly Lakeia Bradshaw, PA-C 07/14/14 [id number]  Marisa Severinlga Otter, MD 07/14/14 [phone]

## 2014-07-14 NOTE — ED Notes (Signed)
Patient to ultrasound

## 2014-07-14 NOTE — Discharge Instructions (Signed)
Please read and follow all provided instructions.  Your diagnoses today include:  1. Pelvic pain in female   2. Lower abdominal pain   3. Vaginal discharge     Tests performed today include:  Blood counts and electrolytes  Blood tests to check liver and kidney function  Blood tests to check pancreas function  Urine test to look for infection and pregnancy (in women)  Ultrasound - no twisting of the ovaries, abscess, or other problems  Vital signs. See below for your results today.   Medications prescribed:   Vicodin (hydrocodone/acetaminophen) - narcotic pain medication  DO NOT drive or perform any activities that require you to be awake and alert because this medicine can make you drowsy. BE VERY CAREFUL not to take multiple medicines containing Tylenol (also called acetaminophen). Doing so can lead to an overdose which can damage your liver and cause liver failure and possibly death.   Zofran (ondansetron) - for nausea and vomiting  Take any prescribed medications only as directed.  Home care instructions:   Follow any educational materials contained in this packet.  Follow-up instructions: Please follow-up with your primary care provider in the next 2 days for further evaluation of your symptoms.    Return instructions:  SEEK IMMEDIATE MEDICAL ATTENTION IF:  The pain does not go away or becomes severe   A temperature above 101F develops   Repeated vomiting occurs (multiple episodes)   The pain becomes localized to portions of the abdomen. The right side could possibly be appendicitis. In an adult, the left lower portion of the abdomen could be colitis or diverticulitis.   Blood is being passed in stools or vomit (bright red or black tarry stools)   You develop chest pain, difficulty breathing, dizziness or fainting, or become confused, poorly responsive, or inconsolable (young children)  If you have any other emergent concerns regarding your  health  Additional Information: Abdominal (belly) pain can be caused by many things. Your caregiver performed an examination and possibly ordered blood/urine tests and imaging (CT scan, x-rays, ultrasound). Many cases can be observed and treated at home after initial evaluation in the emergency department. Even though you are being discharged home, abdominal pain can be unpredictable. Therefore, you need a repeated exam if your pain does not resolve, returns, or worsens. Most patients with abdominal pain don't have to be admitted to the hospital or have surgery, but serious problems like appendicitis and gallbladder attacks can start out as nonspecific pain. Many abdominal conditions cannot be diagnosed in one visit, so follow-up evaluations are very important.  Your vital signs today were: BP 121/63 mmHg   Pulse 75   Temp(Src) 98.2 F (36.8 C) (Oral)   Resp 16   Ht 5\' 6"  (1.676 m)   Wt 230 lb (104.327 kg)   BMI 37.14 kg/m2   SpO2 99%   LMP 06/27/2014 If your blood pressure (bp) was elevated above 135/85 this visit, please have this repeated by your doctor within one month. --------------

## 2015-01-31 ENCOUNTER — Encounter (HOSPITAL_COMMUNITY): Payer: Self-pay | Admitting: Emergency Medicine

## 2015-01-31 ENCOUNTER — Emergency Department (INDEPENDENT_AMBULATORY_CARE_PROVIDER_SITE_OTHER)
Admission: EM | Admit: 2015-01-31 | Discharge: 2015-01-31 | Disposition: A | Discharge status: Home / Self Care | Payer: Self-pay | Source: Home / Self Care

## 2015-01-31 DIAGNOSIS — H6691 Otitis media, unspecified, right ear: Secondary | ICD-10-CM

## 2015-01-31 DIAGNOSIS — M940 Chondrocostal junction syndrome [Tietze]: Secondary | ICD-10-CM

## 2015-01-31 DIAGNOSIS — J069 Acute upper respiratory infection, unspecified: Secondary | ICD-10-CM

## 2015-01-31 MED ORDER — AMOXICILLIN 500 MG PO CAPS: 500.0000 mg | ORAL_CAPSULE | Freq: Three times a day (TID) | ORAL | Status: DC

## 2015-01-31 MED ORDER — AMOXICILLIN 500 MG PO CAPS
500.0000 mg | ORAL_CAPSULE | Freq: Three times a day (TID) | ORAL | Status: DC
Start: 2015-01-31 — End: 2015-01-31

## 2015-01-31 NOTE — ED Provider Notes (Signed)
CSN: [id number]     Arrival date & time 01/31/15  1519 History   None    No chief complaint on file.  (Consider location/radiation/quality/duration/timing/severity/associated sxs/prior Treatment) HPI  History obtained from patient:   LOCATION:upper resp SEVERITY: DURATION:1 day CONTEXT:sudden onset QUALITY:weight on chest MODIFYING FACTORS: no home treatment ASSOCIATED SYMPTOMS: cough, ear ache TIMING:constant OCCUPATION: pharmacy tech   Past Medical History  Diagnosis Date  . Obesity   . Goiter   . Hypertension   . Thyroiditis, autoimmune   . Dyspepsia   . Hypothyroid   . Fainting episodes   . Diabetes mellitus   . Asthma 11/2009  . H/O: menorrhagia 11/2009  . H/O dysmenorrhea 11/2009  . Thyroid activity decreased    Past Surgical History  Procedure Laterality Date  . Tympanostomy tube placement    . Cleft palate repair     Family History  Problem Relation Age of Onset  . Obesity Mother   . Hypertension Mother   . Obesity Father   . Cancer Maternal Grandmother   . Thyroid disease Neg Hx    Social History  Substance Use Topics  . Smoking status: Never Smoker   . Smokeless tobacco: Never Used  . Alcohol Use: No   OB History    No data available     Review of Systems ROS +'ve cough, chest heaviness  Denies: HEADACHE, NAUSEA, ABDOMINAL PAIN, CHEST PAIN, CONGESTION, DYSURIA, SHORTNESS OF BREATH  Allergies  Review of patient's allergies indicates no known allergies.  Home Medications   Prior to Admission medications   Medication Sig Start Date End Date Taking? Authorizing Provider  ACCU-CHEK FASTCLIX LANCETS MISC 102 each by Does not apply route 2 times daily at 12 noon and 4 pm. Check sugar 10 x daily 02/01/11   David StallMichael J Brennan, MD  albuterol (PROVENTIL HFA;VENTOLIN HFA) 108 (90 BASE) MCG/ACT inhaler Inhale into the lungs every 6 (six) hours as needed for wheezing or shortness of breath.    Historical Provider, MD  albuterol (PROVENTIL) (2.5 MG/3ML)  0.083% nebulizer solution Take 3 mLs (2.5 mg total) by nebulization every 6 (six) hours as needed for wheezing. 12/25/11   Benjiman CoreNathan Pickering, MD  HYDROcodone-acetaminophen (NORCO/VICODIN) 5-325 MG per tablet Take 1-2 tablets every 6 hours as needed for severe pain 07/14/14   Renne CriglerJoshua Geiple, PA-C  hydrOXYzine (ATARAX/VISTARIL) 10 MG tablet Take 10-20 mg by mouth 3 (three) times daily. Take 1 tablet every morning and afternoon then take 2 tablets at night    Historical Provider, MD  levothyroxine (SYNTHROID, LEVOTHROID) 25 MCG tablet Take 25 mcg by mouth daily. Brand Name Synthroid only 12/14/11   David StallMichael J Brennan, MD  metFORMIN (GLUCOPHAGE) 500 MG tablet Take 1 tablet (500 mg total) by mouth 2 (two) times daily with a meal. 09/18/11   Dessa PhiJennifer Badik, MD  ondansetron (ZOFRAN ODT) 4 MG disintegrating tablet Take 1 tablet (4 mg total) by mouth every 8 (eight) hours as needed for nausea or vomiting. 07/14/14   Renne CriglerJoshua Geiple, PA-C  oxyCODONE-acetaminophen (PERCOCET/ROXICET) 5-325 MG per tablet 1 to 2 tabs PO q6hrs  PRN for pain 04/21/14   Joni ReiningNicole Pisciotta, PA-C  SUMAtriptan (IMITREX) 100 MG tablet Take 1 tablet (100 mg total) by mouth every 2 (two) hours as needed for migraine. May repeat in 2 hours if headache persists or recurs. MAXIMUM 2 DOSES IN 24 HOURS 12/05/12   Elpidio AnisShari Upstill, PA-C   Meds Ordered and Administered this Visit  Medications - No data to display  There were  no vitals taken for this visit. No data found.   Physical Exam  Constitutional: She is oriented to person, place, and time. She appears well-developed and well-nourished.  HENT:  Head: Normocephalic and atraumatic.  Right Ear: Tympanic membrane is erythematous and bulging. Tympanic membrane mobility is abnormal.  Left Ear: External ear normal.  Mouth/Throat: Oropharynx is clear and moist.  Eyes: Conjunctivae are normal.  Neck: Normal range of motion. Neck supple.  Cardiovascular: Normal rate.   Pulmonary/Chest: Effort normal and  breath sounds normal. She exhibits tenderness.  Abdominal: Soft. Bowel sounds are normal.  Musculoskeletal: Normal range of motion.  Neurological: She is alert and oriented to person, place, and time.  Skin: Skin is warm and dry.  Psychiatric: She has a normal mood and affect. Her behavior is normal. Judgment and thought content normal.    ED Course  Procedures (including critical care time)  Labs Review Labs Reviewed - No data to display  Imaging Review No results found.   Visual Acuity Review  Right Eye Distance:   Left Eye Distance:   Bilateral Distance:    Right Eye Near:   Left Eye Near:    Bilateral Near:         MDM   1. URI (upper respiratory infection)   2. Costochondritis   3. Acute right otitis media, recurrence not specified, unspecified otitis media type      Review of ECG does not reveal any acute changes. NSR.    Tharon Aquas, PA 01/31/15 2024

## 2015-01-31 NOTE — ED Notes (Signed)
Pt here with URI sx's productive cough with yellow phlegm, post nasal drip and sore throat States the pain is progressing to chest with breathing  Hx Asthma, using nebulizer Sx started yesterday

## 2015-05-02 ENCOUNTER — Encounter (HOSPITAL_COMMUNITY): Payer: Self-pay | Admitting: *Deleted

## 2015-05-02 ENCOUNTER — Emergency Department (HOSPITAL_COMMUNITY)
Admission: EM | Admit: 2015-05-02 | Discharge: 2015-05-02 | Disposition: A | Discharge status: Home / Self Care | Payer: Self-pay | Attending: Emergency Medicine | Admitting: Emergency Medicine

## 2015-05-02 DIAGNOSIS — I10 Essential (primary) hypertension: Secondary | ICD-10-CM | POA: Insufficient documentation

## 2015-05-02 DIAGNOSIS — H9203 Otalgia, bilateral: Secondary | ICD-10-CM | POA: Insufficient documentation

## 2015-05-02 DIAGNOSIS — E119 Type 2 diabetes mellitus without complications: Secondary | ICD-10-CM | POA: Insufficient documentation

## 2015-05-02 DIAGNOSIS — E063 Autoimmune thyroiditis: Secondary | ICD-10-CM | POA: Insufficient documentation

## 2015-05-02 DIAGNOSIS — Z79899 Other long term (current) drug therapy: Secondary | ICD-10-CM | POA: Insufficient documentation

## 2015-05-02 DIAGNOSIS — E049 Nontoxic goiter, unspecified: Secondary | ICD-10-CM | POA: Insufficient documentation

## 2015-05-02 DIAGNOSIS — J45909 Unspecified asthma, uncomplicated: Secondary | ICD-10-CM | POA: Insufficient documentation

## 2015-05-02 DIAGNOSIS — Z792 Long term (current) use of antibiotics: Secondary | ICD-10-CM | POA: Insufficient documentation

## 2015-05-02 DIAGNOSIS — Z7984 Long term (current) use of oral hypoglycemic drugs: Secondary | ICD-10-CM | POA: Insufficient documentation

## 2015-05-02 DIAGNOSIS — H109 Unspecified conjunctivitis: Secondary | ICD-10-CM | POA: Insufficient documentation

## 2015-05-02 DIAGNOSIS — E039 Hypothyroidism, unspecified: Secondary | ICD-10-CM | POA: Insufficient documentation

## 2015-05-02 DIAGNOSIS — Z8742 Personal history of other diseases of the female genital tract: Secondary | ICD-10-CM | POA: Insufficient documentation

## 2015-05-02 DIAGNOSIS — E669 Obesity, unspecified: Secondary | ICD-10-CM | POA: Insufficient documentation

## 2015-05-02 DIAGNOSIS — J0110 Acute frontal sinusitis, unspecified: Secondary | ICD-10-CM | POA: Insufficient documentation

## 2015-05-02 MED ORDER — FLUCONAZOLE 150 MG PO TABS: 150.0000 mg | ORAL_TABLET | Freq: Once | ORAL | Status: DC

## 2015-05-02 MED ORDER — AMOXICILLIN 500 MG PO CAPS: 500.0000 mg | ORAL_CAPSULE | Freq: Three times a day (TID) | ORAL | Status: DC

## 2015-05-02 MED ORDER — POLYMYXIN B-TRIMETHOPRIM 10000-0.1 UNIT/ML-% OP SOLN: 1.0000 [drp] | OPHTHALMIC | Status: DC

## 2015-05-02 NOTE — ED Notes (Signed)
Declined W/C at D/C and was escorted to lobby by RN. 

## 2015-05-02 NOTE — ED Notes (Signed)
PT reports URI started  Last thursday and pink eye started last night.

## 2015-05-02 NOTE — Discharge Instructions (Signed)
Take the prescribed medication as directed. Recommend to wear glasses until your eye symptoms improve. Follow-up with your primary care physician. Return to the ED for new or worsening symptoms.

## 2015-05-02 NOTE — ED Provider Notes (Signed)
CSN: [id number]     Arrival date & time 05/02/15  1251 History  By signing my name below, I, Essence Howell, attest that this documentation has been prepared under the direction and in the presence of Sharilyn Sites, PA-C Electronically Signed: Charline Bills, ED Scribe 05/02/2015 at 2:41 PM.   Chief Complaint  Patient presents with  . URI  . Eye Problem   The history is provided by the patient. No language interpreter was used.   HPI Comments: Ashlee Herman is a 25 y.o. female who presents to the Emergency Department complaining of eye itching onset yesterday. Pt states that she was recently around her niece who was ill with a sinus infection and conjunctivitis. Pt reports associated eye redness, eye discharge with crusting upon waking this morning, bilateral ear pain, congestion, sore throat, and cough.  No chest pain or SOB. Pt is currently wearing glasses; states that she does wear contact lenses but was not wearing her contacts when symptoms began. No treatments tried PTA. Pt denies fever.  VSS.  Past Medical History  Diagnosis Date  . Obesity   . Goiter   . Hypertension   . Thyroiditis, autoimmune   . Dyspepsia   . Hypothyroid   . Fainting episodes   . Diabetes mellitus   . Asthma 11/2009  . H/O: menorrhagia 11/2009  . H/O dysmenorrhea 11/2009  . Thyroid activity decreased    Past Surgical History  Procedure Laterality Date  . Tympanostomy tube placement    . Cleft palate repair     Family History  Problem Relation Age of Onset  . Obesity Mother   . Hypertension Mother   . Obesity Father   . Cancer Maternal Grandmother   . Thyroid disease Neg Hx    Social History  Substance Use Topics  . Smoking status: Never Smoker   . Smokeless tobacco: Never Used  . Alcohol Use: No   OB History    No data available     Review of Systems  Constitutional: Negative for fever.  HENT: Positive for congestion, ear pain and sore throat.   Eyes: Positive for discharge, redness and  itching.  Respiratory: Positive for cough.   All other systems reviewed and are negative.  Allergies  Review of patient's allergies indicates no known allergies.  Home Medications   Prior to Admission medications   Medication Sig Start Date End Date Taking? Authorizing Provider  ACCU-CHEK FASTCLIX LANCETS MISC 102 each by Does not apply route 2 times daily at 12 noon and 4 pm. Check sugar 10 x daily 02/01/11   David Stall, MD  albuterol (PROVENTIL HFA;VENTOLIN HFA) 108 (90 BASE) MCG/ACT inhaler Inhale into the lungs every 6 (six) hours as needed for wheezing or shortness of breath.    Historical Provider, MD  albuterol (PROVENTIL) (2.5 MG/3ML) 0.083% nebulizer solution Take 3 mLs (2.5 mg total) by nebulization every 6 (six) hours as needed for wheezing. 12/25/11   Benjiman Core, MD  amoxicillin (AMOXIL) 500 MG capsule Take 1 capsule (500 mg total) by mouth 3 (three) times daily. 01/31/15   Tharon Aquas, PA  HYDROcodone-acetaminophen (NORCO/VICODIN) 5-325 MG per tablet Take 1-2 tablets every 6 hours as needed for severe pain 07/14/14   Renne Crigler, PA-C  hydrOXYzine (ATARAX/VISTARIL) 10 MG tablet Take 10-20 mg by mouth 3 (three) times daily. Take 1 tablet every morning and afternoon then take 2 tablets at night    Historical Provider, MD  levothyroxine (SYNTHROID, LEVOTHROID) 25 MCG tablet  Take 25 mcg by mouth daily. Brand Name Synthroid only 12/14/11   David Stall, MD  metFORMIN (GLUCOPHAGE) 500 MG tablet Take 1 tablet (500 mg total) by mouth 2 (two) times daily with a meal. 09/18/11   Dessa Phi, MD  ondansetron (ZOFRAN ODT) 4 MG disintegrating tablet Take 1 tablet (4 mg total) by mouth every 8 (eight) hours as needed for nausea or vomiting. 07/14/14   Renne Crigler, PA-C  oxyCODONE-acetaminophen (PERCOCET/ROXICET) 5-325 MG per tablet 1 to 2 tabs PO q6hrs  PRN for pain 04/21/14   Joni Reining Pisciotta, PA-C  SUMAtriptan (IMITREX) 100 MG tablet Take 1 tablet (100 mg total) by  mouth every 2 (two) hours as needed for migraine. May repeat in 2 hours if headache persists or recurs. MAXIMUM 2 DOSES IN 24 HOURS 12/05/12   Shari Upstill, PA-C   BP 132/78 mmHg  Pulse 80  Temp(Src) 98.3 F (36.8 C)  Resp 20  SpO2 100% Physical Exam  Constitutional: She is oriented to person, place, and time. She appears well-developed and well-nourished.  HENT:  Head: Normocephalic and atraumatic.  Right Ear: Tympanic membrane and ear canal normal.  Left Ear: Tympanic membrane and ear canal normal.  Nose: Right sinus exhibits maxillary sinus tenderness and frontal sinus tenderness. Left sinus exhibits maxillary sinus tenderness and frontal sinus tenderness.  Mouth/Throat: Uvula is midline, oropharynx is clear and moist and mucous membranes are normal. No oropharyngeal exudate, posterior oropharyngeal edema, posterior oropharyngeal erythema or tonsillar abscesses.  Eyes: EOM and lids are normal. Pupils are equal, round, and reactive to light. Right conjunctiva is injected. Left conjunctiva is injected.  No lid edema or erythema, conjunctiva injected bilaterally, no drainage or tearing, EOMs fully intact Visual Acuity - Bilateral Distance: 20/20 with corrective lens ; R Distance: 20/25 with corrective lens ; L Distance: 20/20 with corrective lens  Neck: Normal range of motion.  Cardiovascular: Normal rate, regular rhythm and normal heart sounds.   Pulmonary/Chest: Effort normal and breath sounds normal.  Abdominal: Soft. Bowel sounds are normal.  Musculoskeletal: Normal range of motion.  Neurological: She is alert and oriented to person, place, and time.  Skin: Skin is warm and dry.  Psychiatric: She has a normal mood and affect.  Nursing note and vitals reviewed.  ED Course  Procedures (including critical care time) DIAGNOSTIC STUDIES: Oxygen Saturation is 100% on RA, normal by my interpretation.    COORDINATION OF CARE: 2:29 PM-Discussed treatment plan with pt at bedside and pt  agreed to plan.   Labs Review Labs Reviewed - No data to display  Imaging Review No results found.   EKG Interpretation None      MDM   Final diagnoses:  Acute frontal sinusitis, recurrence not specified  Bilateral conjunctivitis   25 y.o. F here with eye redness and congestion. Recent sick contacts with similar symptoms. Patient is afebrile, nontoxic. Exam findings are consistent with sinusitis and conjunctivitis. Started on Polytrim drops and amoxicillin. She is also given Diflucan prescription as she reports she is prone to yeast infections with antibiotics use.  Recommended to wear glasses until eye symptoms improve.  Discussed plan with patient, he/she acknowledged understanding and agreed with plan of care.  Return precautions given for new or worsening symptoms.  I personally performed the services described in this documentation, which was scribed in my presence. The recorded information has been reviewed and is accurate.  Garlon Hatchet, PA-C 05/02/15 1555  Vanetta Mulders, MD 05/06/15 [phone]

## 2015-06-28 ENCOUNTER — Emergency Department (HOSPITAL_COMMUNITY): Payer: Self-pay

## 2015-06-28 ENCOUNTER — Emergency Department (HOSPITAL_COMMUNITY)
Admission: EM | Admit: 2015-06-28 | Discharge: 2015-06-28 | Disposition: A | Discharge status: Home / Self Care | Payer: Self-pay | Attending: Emergency Medicine | Admitting: Emergency Medicine

## 2015-06-28 DIAGNOSIS — Z7984 Long term (current) use of oral hypoglycemic drugs: Secondary | ICD-10-CM | POA: Insufficient documentation

## 2015-06-28 DIAGNOSIS — Z792 Long term (current) use of antibiotics: Secondary | ICD-10-CM | POA: Insufficient documentation

## 2015-06-28 DIAGNOSIS — E063 Autoimmune thyroiditis: Secondary | ICD-10-CM | POA: Insufficient documentation

## 2015-06-28 DIAGNOSIS — Z79899 Other long term (current) drug therapy: Secondary | ICD-10-CM | POA: Insufficient documentation

## 2015-06-28 DIAGNOSIS — J45901 Unspecified asthma with (acute) exacerbation: Secondary | ICD-10-CM | POA: Insufficient documentation

## 2015-06-28 DIAGNOSIS — E039 Hypothyroidism, unspecified: Secondary | ICD-10-CM | POA: Insufficient documentation

## 2015-06-28 DIAGNOSIS — J069 Acute upper respiratory infection, unspecified: Secondary | ICD-10-CM

## 2015-06-28 DIAGNOSIS — Z8742 Personal history of other diseases of the female genital tract: Secondary | ICD-10-CM | POA: Insufficient documentation

## 2015-06-28 DIAGNOSIS — E049 Nontoxic goiter, unspecified: Secondary | ICD-10-CM | POA: Insufficient documentation

## 2015-06-28 DIAGNOSIS — E669 Obesity, unspecified: Secondary | ICD-10-CM | POA: Insufficient documentation

## 2015-06-28 DIAGNOSIS — E119 Type 2 diabetes mellitus without complications: Secondary | ICD-10-CM | POA: Insufficient documentation

## 2015-06-28 DIAGNOSIS — H6691 Otitis media, unspecified, right ear: Secondary | ICD-10-CM

## 2015-06-28 MED ORDER — BENZONATATE 100 MG PO CAPS
100.0000 mg | ORAL_CAPSULE | Freq: Once | ORAL | Status: AC
  Administered 2015-06-28: 100 mg via ORAL
  Filled 2015-06-28: qty 1

## 2015-06-28 MED ORDER — ACETAMINOPHEN 325 MG PO TABS
650.0000 mg | ORAL_TABLET | Freq: Once | ORAL | Status: AC
  Administered 2015-06-28: 650 mg via ORAL
  Filled 2015-06-28: qty 2

## 2015-06-28 MED ORDER — AMOXICILLIN-POT CLAVULANATE 875-125 MG PO TABS: 1.0000 | ORAL_TABLET | Freq: Two times a day (BID) | ORAL | Status: DC

## 2015-06-28 NOTE — ED Provider Notes (Signed)
CSN: [id number]     Arrival date & time 06/28/15  1015 History   First MD Initiated Contact with Patient 06/28/15 1121     Chief Complaint  Patient presents with  . Otalgia    HPI   Ashlee Herman is a 25 y.o. female with a PMH of HTN, DM, thyroiditis who presents to the ED with sinus pressure, nasal congestion, cough productive of yellow sputum, and bilateral ear pain x 3-4 days. She denies exacerbating factors. She states she has tried OTC cold and cough medication with no significant symptom relief. She denies fever or chills. She reports shortness of breath with cough. She denies chest pain, abdominal pain, N/V. She states she has a history of ear infections and had tubes when she was a child.   Past Medical History  Diagnosis Date  . Obesity   . Goiter   . Hypertension   . Thyroiditis, autoimmune   . Dyspepsia   . Hypothyroid   . Fainting episodes   . Diabetes mellitus   . Asthma 11/2009  . H/O: menorrhagia 11/2009  . H/O dysmenorrhea 11/2009  . Thyroid activity decreased    Past Surgical History  Procedure Laterality Date  . Tympanostomy tube placement    . Cleft palate repair     Family History  Problem Relation Age of Onset  . Obesity Mother   . Hypertension Mother   . Obesity Father   . Cancer Maternal Grandmother   . Thyroid disease Neg Hx    Social History  Substance Use Topics  . Smoking status: Never Smoker   . Smokeless tobacco: Never Used  . Alcohol Use: No   OB History    No data available      Review of Systems  Constitutional: Negative for fever and chills.  HENT: Positive for congestion and sinus pressure. Negative for sore throat.   Respiratory: Positive for cough and shortness of breath.   Cardiovascular: Negative for chest pain.  Gastrointestinal: Negative for nausea, vomiting and abdominal pain.  All other systems reviewed and are negative.     Allergies  Review of patient's allergies indicates no known allergies.  Home Medications    Prior to Admission medications   Medication Sig Start Date End Date Taking? Authorizing Provider  ACCU-CHEK FASTCLIX LANCETS MISC 102 each by Does not apply route 2 times daily at 12 noon and 4 pm. Check sugar 10 x daily 02/01/11   David Stall, MD  albuterol (PROVENTIL HFA;VENTOLIN HFA) 108 (90 BASE) MCG/ACT inhaler Inhale into the lungs every 6 (six) hours as needed for wheezing or shortness of breath.    Historical Provider, MD  albuterol (PROVENTIL) (2.5 MG/3ML) 0.083% nebulizer solution Take 3 mLs (2.5 mg total) by nebulization every 6 (six) hours as needed for wheezing. 12/25/11   Benjiman Core, MD  amoxicillin (AMOXIL) 500 MG capsule Take 1 capsule (500 mg total) by mouth 3 (three) times daily. 05/02/15   Garlon Hatchet, PA-C  amoxicillin-clavulanate (AUGMENTIN) 875-125 MG tablet Take 1 tablet by mouth 2 (two) times daily. 06/28/15   Mady Gemma, PA-C  fluconazole (DIFLUCAN) 150 MG tablet Take 1 tablet (150 mg total) by mouth once. 05/02/15   Garlon Hatchet, PA-C  HYDROcodone-acetaminophen (NORCO/VICODIN) 5-325 MG per tablet Take 1-2 tablets every 6 hours as needed for severe pain 07/14/14   Renne Crigler, PA-C  hydrOXYzine (ATARAX/VISTARIL) 10 MG tablet Take 10-20 mg by mouth 3 (three) times daily. Take 1 tablet every morning and  afternoon then take 2 tablets at night    Historical Provider, MD  levothyroxine (SYNTHROID, LEVOTHROID) 25 MCG tablet Take 25 mcg by mouth daily. Brand Name Synthroid only 12/14/11   David Stall, MD  metFORMIN (GLUCOPHAGE) 500 MG tablet Take 1 tablet (500 mg total) by mouth 2 (two) times daily with a meal. 09/18/11   Dessa Phi, MD  ondansetron (ZOFRAN ODT) 4 MG disintegrating tablet Take 1 tablet (4 mg total) by mouth every 8 (eight) hours as needed for nausea or vomiting. 07/14/14   Renne Crigler, PA-C  oxyCODONE-acetaminophen (PERCOCET/ROXICET) 5-325 MG per tablet 1 to 2 tabs PO q6hrs  PRN for pain 04/21/14   Joni Reining Pisciotta, PA-C   SUMAtriptan (IMITREX) 100 MG tablet Take 1 tablet (100 mg total) by mouth every 2 (two) hours as needed for migraine. May repeat in 2 hours if headache persists or recurs. MAXIMUM 2 DOSES IN 24 HOURS 12/05/12   Elpidio Anis, PA-C  trimethoprim-polymyxin b (POLYTRIM) ophthalmic solution Place 1 drop into both eyes every 4 (four) hours. 05/02/15   Garlon Hatchet, PA-C    BP 115/69 mmHg  Pulse 52  Temp(Src) 98.2 F (36.8 C) (Oral)  Resp 18  Ht  (1.651 m)  Wt 111.131 kg  BMI 40.77 kg/m2  SpO2 100% Physical Exam  Constitutional: She is oriented to person, place, and time. She appears well-developed and well-nourished. No distress.  HENT:  Head: Normocephalic and atraumatic.  Right Ear: Hearing, external ear and ear canal normal. Tympanic membrane is erythematous.  Left Ear: Hearing, external ear and ear canal normal. Tympanic membrane is scarred.  Nose: Right sinus exhibits maxillary sinus tenderness and frontal sinus tenderness. Left sinus exhibits maxillary sinus tenderness and frontal sinus tenderness.  Mouth/Throat: Uvula is midline, oropharynx is clear and moist and mucous membranes are normal. No oropharyngeal exudate, posterior oropharyngeal edema, posterior oropharyngeal erythema or tonsillar abscesses.  Eyes: Conjunctivae, EOM and lids are normal. Pupils are equal, round, and reactive to light. Right eye exhibits no discharge. Left eye exhibits no discharge. No scleral icterus.  Neck: Normal range of motion. Neck supple.  Cardiovascular: Normal rate, regular rhythm, normal heart sounds, intact distal pulses and normal pulses.   Pulmonary/Chest: Effort normal and breath sounds normal. No respiratory distress. She has no wheezes. She has no rales.  Abdominal: Soft. Normal appearance and bowel sounds are normal. She exhibits no distension and no mass. There is no tenderness. There is no rigidity, no rebound and no guarding.  Musculoskeletal: Normal range of motion. She exhibits no  edema or tenderness.  Neurological: She is alert and oriented to person, place, and time.  Skin: Skin is warm, dry and intact. No rash noted. She is not diaphoretic. No erythema. No pallor.  Psychiatric: She has a normal mood and affect. Her speech is normal and behavior is normal.  Nursing note and vitals reviewed.   ED Course  Procedures (including critical care time)  Labs Review Labs Reviewed - No data to display  Imaging Review Dg Chest 2 View  06/28/2015  CLINICAL DATA:  Nasal congestion, sinus pain and decreased hearing in both the ears for 1 week. Initial encounter. EXAM: CHEST  2 VIEW COMPARISON:  PA and lateral chest 12/25/2011. FINDINGS: The lungs are clear. Heart size is normal. No pneumothorax or pleural effusion. No focal bony abnormality. IMPRESSION: No acute disease. Electronically Signed   By: Drusilla Kanner M.D.   On: 06/28/2015 12:38   I have personally reviewed and evaluated  these images as part of my medical decision-making.   EKG Interpretation None      MDM   Final diagnoses:  URI (upper respiratory infection)  Acute right otitis media, recurrence not specified, unspecified otitis media type    25 year old female presents with sinus pressure, nasal congestion, cough productive of yellow sputum, and bilateral ear pain x 3-4 days. Denies fever or chills. Reports shortness of breath with cough. Denies chest pain, abdominal pain, N/V. Patient is afebrile. Vital signs stable. Erythema to right TM. Left TM clear. No erythema, edema, or exudate posterior oropharynx. TTP to sinuses bilaterally. Heart regular rate and rhythm. Lungs clear to auscultation bilaterally. Abdomen soft, nontender, nondistended. Chest x-ray negative for acute disease. Discussed findings with patient. She is nontoxic and well-appearing, feel she is stable for discharge at this time. Will give augmentin for otitis media (patient recently on amoxicillin for possible sinusitis). Patient to  follow-up with PCP. Return precautions discussed. Patient verbalizes her understanding and is in agreement with plan.  BP 115/69 mmHg  Pulse 52  Temp(Src) 98.2 F (36.8 C) (Oral)  Resp 18  Ht 5\' 5"  (1.651 m)  Wt 111.131 kg  BMI 40.77 kg/m2  SpO2 100%     Mady Gemmalizabeth C Westfall, PA-C 06/28/15 1346  Raeford RazorStephen Kohut, MD 07/06/15 1310

## 2015-06-28 NOTE — Discharge Instructions (Signed)
1. Medications: augmentin, usual home medications 2. Treatment: rest, drink plenty of fluids 3. Follow Up: please followup with your primary doctor for discussion of your diagnoses and further evaluation after today's visit; if you do not have a primary care doctor use the phone number listed in your discharge paperwork to find one; please return to the ER for new or worsening symptoms   Upper Respiratory Infection, Adult Most upper respiratory infections (URIs) are caused by a virus. A URI affects the nose, throat, and upper air passages. The most common type of URI is often called "the common cold." HOME CARE   Take medicines only as told by your doctor.  Gargle warm saltwater or take cough drops to comfort your throat as told by your doctor.  Use a warm mist humidifier or inhale steam from a shower to increase air moisture. This may make it easier to breathe.  Drink enough fluid to keep your pee (urine) clear or pale yellow.  Eat soups and other clear broths.  Have a healthy diet.  Rest as needed.  Go back to work when your fever is gone or your doctor says it is okay.  You may need to stay home longer to avoid giving your URI to others.  You can also wear a face mask and wash your hands often to prevent spread of the virus.  Use your inhaler more if you have asthma.  Do not use any tobacco products, including cigarettes, chewing tobacco, or electronic cigarettes. If you need help quitting, ask your doctor. GET HELP IF:  You are getting worse, not better.  Your symptoms are not helped by medicine.  You have chills.  You are getting more short of breath.  You have brown or red mucus.  You have yellow or brown discharge from your nose.  You have pain in your face, especially when you bend forward.  You have a fever.  You have puffy (swollen) neck glands.  You have pain while swallowing.  You have white areas in the back of your throat. GET HELP RIGHT AWAY IF:     You have very bad or constant:  Headache.  Ear pain.  Pain in your forehead, behind your eyes, and over your cheekbones (sinus pain).  Chest pain.  You have long-lasting (chronic) lung disease and any of the following:  Wheezing.  Long-lasting cough.  Coughing up blood.  A change in your usual mucus.  You have a stiff neck.  You have changes in your:  Vision.  Hearing.  Thinking.  Mood. MAKE SURE YOU:   Understand these instructions.  Will watch your condition.  Will get help right away if you are not doing well or get worse.   This information is not intended to replace advice given to you by your health care provider. Make sure you discuss any questions you have with your health care provider.   Document Released: 08/08/2007 Document Revised: 07/06/2014 Document Reviewed: 05/27/2013 Elsevier Interactive Patient Education 2016 [employer]  Otitis Media, Adult Otitis media is redness, soreness, and puffiness (swelling) in the space just behind your eardrum (middle ear). It may be caused by allergies or infection. It often happens along with a cold. HOME CARE  Take your medicine as told. Finish it even if you start to feel better.  Only take over-the-counter or prescription medicines for pain, discomfort, or fever as told by your doctor.  Follow up with your doctor as told. GET HELP IF:  You have otitis  media only in one ear, or bleeding from your nose, or both.  You notice a lump on your neck.  You are not getting better in 3-5 days.  You feel worse instead of better. GET HELP RIGHT AWAY IF:   You have pain that is not helped with medicine.  You have puffiness, redness, or pain around your ear.  You get a stiff neck.  You cannot move part of your face (paralysis).  You notice that the bone behind your ear hurts when you touch it. MAKE SURE YOU:   Understand these instructions.  Will watch your condition.  Will get help right away  if you are not doing well or get worse.   This information is not intended to replace advice given to you by your health care provider. Make sure you discuss any questions you have with your health care provider.   Document Released: 08/08/2007 Document Revised: 03/12/2014 Document Reviewed: 09/16/2012 Elsevier Interactive Patient Education [employer]

## 2015-06-28 NOTE — ED Notes (Signed)
Pt reports nasal congestion and sinus pain and diminshed hearing in both ears. Denies recent fever. Reports using OTC medications with little improvement. VSS.

## 2016-02-13 ENCOUNTER — Encounter (HOSPITAL_COMMUNITY): Payer: Self-pay | Admitting: Emergency Medicine

## 2016-02-13 ENCOUNTER — Emergency Department (HOSPITAL_COMMUNITY)
Admission: EM | Admit: 2016-02-13 | Discharge: 2016-02-13 | Disposition: A | Discharge status: Home / Self Care | Payer: Self-pay | Attending: Emergency Medicine | Admitting: Emergency Medicine

## 2016-02-13 ENCOUNTER — Emergency Department (HOSPITAL_COMMUNITY): Payer: Self-pay

## 2016-02-13 DIAGNOSIS — E039 Hypothyroidism, unspecified: Secondary | ICD-10-CM | POA: Insufficient documentation

## 2016-02-13 DIAGNOSIS — E119 Type 2 diabetes mellitus without complications: Secondary | ICD-10-CM | POA: Insufficient documentation

## 2016-02-13 DIAGNOSIS — I10 Essential (primary) hypertension: Secondary | ICD-10-CM | POA: Insufficient documentation

## 2016-02-13 DIAGNOSIS — H6501 Acute serous otitis media, right ear: Secondary | ICD-10-CM | POA: Insufficient documentation

## 2016-02-13 DIAGNOSIS — J40 Bronchitis, not specified as acute or chronic: Secondary | ICD-10-CM | POA: Insufficient documentation

## 2016-02-13 DIAGNOSIS — Z7984 Long term (current) use of oral hypoglycemic drugs: Secondary | ICD-10-CM | POA: Insufficient documentation

## 2016-02-13 MED ORDER — FLUCONAZOLE 200 MG PO TABS: ORAL_TABLET | ORAL | 0 refills | Status: DC

## 2016-02-13 MED ORDER — BENZONATATE 100 MG PO CAPS: 100.0000 mg | ORAL_CAPSULE | Freq: Three times a day (TID) | ORAL | 0 refills | Status: DC

## 2016-02-13 MED ORDER — ALBUTEROL SULFATE HFA 108 (90 BASE) MCG/ACT IN AERS
2.0000 | INHALATION_SPRAY | RESPIRATORY_TRACT | Status: DC | PRN
  Administered 2016-02-13: 2 via RESPIRATORY_TRACT
  Filled 2016-02-13: qty 6.7

## 2016-02-13 MED ORDER — AZITHROMYCIN 250 MG PO TABS: ORAL_TABLET | ORAL | 0 refills | Status: DC

## 2016-02-13 MED ORDER — IBUPROFEN 400 MG PO TABS
600.0000 mg | ORAL_TABLET | Freq: Once | ORAL | Status: AC
  Administered 2016-02-13: 600 mg via ORAL
  Filled 2016-02-13: qty 1

## 2016-02-13 MED ORDER — IPRATROPIUM-ALBUTEROL 0.5-2.5 (3) MG/3ML IN SOLN
3.0000 mL | Freq: Once | RESPIRATORY_TRACT | Status: AC
  Administered 2016-02-13: 3 mL via RESPIRATORY_TRACT
  Filled 2016-02-13: qty 3

## 2016-02-13 NOTE — ED Triage Notes (Signed)
Pt. Stated, I've had a cold, congestion, cough, and ear pain that initially started on Dec. 5th. In the last 3 days its become worse.  I've took Delsyn, robitussin DM and regular, Sudafed, and nothing has helped.

## 2016-02-13 NOTE — ED Provider Notes (Signed)
[facility]Y DEPT Provider Note   CSN: [id number] Arrival date & time: 02/13/16  1634 By signing my name below, I, Ashlee Herman, attest that this documentation has been prepared under the direction and in the presence of Kerrie BuffaloHope [name], [place]. Electronically Signed: Bridgette HabermannMaria Herman, ED Scribe. 02/13/16. 5:57 PM.  History   Chief Complaint Chief Complaint  Patient presents with  . URI  . Cough  . Asthma   HPI Comments: Ashlee Herman is a 25 y.o. female with h/o asthma, DM, and HTN who presents to the Emergency Department complaining of congestion onset 6 days ago, worsening 3 days ago. Pt also has associated rhinorrhea, productive cough, bilateral ear pain (L>R), headache, wheezing, diarrhea, and sore throat. Pt has chest pain secondary to her cough. She has taken Delsym, Robitussin DM and regular, Advil, and Sudafed with no relief to her symptoms. She has h/o asthma and has tried an inhaler with no relief. No known sick contacts with similar symptoms. Pt is not a smoker. Pt denies fever, chills, or any other associated symptoms.   The history is provided by the patient. No language interpreter was used.    Past Medical History:  Diagnosis Date  . Asthma 11/2009  . Diabetes mellitus   . Dyspepsia   . Fainting episodes   . Goiter   . H/O dysmenorrhea 11/2009  . H/O: menorrhagia 11/2009  . Hypertension   . Hypothyroid   . Obesity   . Thyroid activity decreased   . Thyroiditis, autoimmune     Patient Active Problem List   Diagnosis Date Noted  . Prediabetes   . Obesity   . Goiter   . Hypertension   . Thyroiditis, autoimmune   . Dyspepsia   . Hypothyroid   . Fainting episodes   . Pre-diabetes 06/12/2010  . Other specified acquired hypothyroidism 06/12/2010    Past Surgical History:  Procedure Laterality Date  . CLEFT PALATE REPAIR    . TYMPANOSTOMY TUBE PLACEMENT      OB History    No data available       Home Medications    Prior to Admission medications   Medication  Sig Start Date End Date Taking? Authorizing Provider  albuterol (PROVENTIL) (2.5 MG/3ML) 0.083% nebulizer solution Take 3 mLs (2.5 mg total) by nebulization every 6 (six) hours as needed for wheezing. Patient not taking: Reported on 02/13/2016 12/25/11   Benjiman CoreNathan Pickering, MD  azithromycin (ZITHROMAX) 250 MG tablet Take first 2 tablets together, then 1 every day until finished. 02/13/16   Arelia Volpe Orlene OchM Melynda Krzywicki, NP  benzonatate (TESSALON) 100 MG capsule Take 1 capsule (100 mg total) by mouth every 8 (eight) hours. 02/13/16   Roise Emert Orlene OchM Shanera Meske, NP  fluconazole (DIFLUCAN) 200 MG tablet Take the tablet PO once 02/13/16   Janne NapoleonHope M Jakwan Sally, NP  levothyroxine (SYNTHROID, LEVOTHROID) 25 MCG tablet Take 25 mcg by mouth daily. Brand Name Synthroid only Patient not taking: Reported on 02/13/2016 12/14/11   David StallMichael J Brennan, MD  metFORMIN (GLUCOPHAGE) 500 MG tablet Take 1 tablet (500 mg total) by mouth 2 (two) times daily with a meal. Patient not taking: Reported on 02/13/2016 09/18/11   Dessa PhiJennifer Badik, MD  SUMAtriptan (IMITREX) 100 MG tablet Take 1 tablet (100 mg total) by mouth every 2 (two) hours as needed for migraine. May repeat in 2 hours if headache persists or recurs. MAXIMUM 2 DOSES IN 24 HOURS Patient not taking: Reported on 02/13/2016 12/05/12   Elpidio AnisShari Upstill, PA-C    Family History Family  History  Problem Relation Age of Onset  . Obesity Mother   . Hypertension Mother   . Obesity Father   . Cancer Maternal Grandmother   . Thyroid disease Neg Hx     Social History Social History  Substance Use Topics  . Smoking status: Never Smoker  . Smokeless tobacco: Never Used  . Alcohol use No     Allergies   Patient has no known allergies.   Review of Systems Review of Systems  Constitutional: Negative for chills and fever.  HENT: Positive for congestion, ear pain, rhinorrhea and sore throat.   Respiratory: Positive for cough and wheezing.   Cardiovascular: Positive for chest pain.  Gastrointestinal:  Positive for diarrhea.  Neurological: Positive for headaches.  All other systems reviewed and are negative.  Physical Exam Updated Vital Signs BP 125/78 (BP Location: Right Arm)   Pulse 96   Temp 98.4 F (36.9 C) (Oral)   Resp 16   Ht 5\' 6"  (1.676 m)   Wt 114.4 kg   LMP 02/07/2016   SpO2 99%   BMI 40.70 kg/m   Physical Exam  Constitutional: She is oriented to person, place, and time. She appears well-developed and well-nourished. No distress.  HENT:  Head: Normocephalic.  Right Ear: Ear canal normal. No mastoid tenderness. Tympanic membrane is erythematous.  Left Ear: Ear canal normal. No mastoid tenderness. Tympanic membrane is erythematous.  Mouth/Throat: Uvula is midline.   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   Eyes: Conjunctivae and EOM are normal.  Neck: Normal range of motion. Neck supple.  Cardiovascular: Normal rate and regular rhythm.   Pulmonary/Chest: Effort normal. No respiratory distress. She has wheezes (inspiratory ).  Abdominal: Soft. There is no tenderness.  Musculoskeletal: Normal range of motion.  Neurological: She is alert and oriented to person, place, and time.  Skin: Skin is warm and dry.  Psychiatric: She has a normal mood and affect. Her behavior is normal.  Nursing note and vitals reviewed.  ED Treatments / Results  DIAGNOSTIC STUDIES: Oxygen Saturation is 97% on RA, adequate by my interpretation.    COORDINATION OF CARE: 5:56 PM Discussed treatment plan with pt at bedside which includes CXR and pt agreed to plan.  Labs (all labs ordered are listed, but only abnormal results are displayed) Labs Reviewed - No data to display  Radiology Dg Chest 2 View  Result Date: 02/13/2016 CLINICAL DATA:  Chest congestion for 6 days EXAM: CHEST  2 VIEW COMPARISON:  06/28/2015 FINDINGS: The heart size and mediastinal contours are within normal limits. Both lungs are clear. The visualized skeletal structures are  unremarkable. IMPRESSION: No active cardiopulmonary disease. Electronically Signed   By: Alcide Clever M.D.   On: 02/13/2016 18:54    Procedures Procedures (including critical care time)  Medications Ordered in ED Medications  ipratropium-albuterol (DUONEB) 0.5-2.5 (3) MG/3ML nebulizer solution 3 mL (3 mLs Nebulization Given 02/13/16 1824)  ibuprofen (ADVIL,MOTRIN) tablet 600 mg (600 mg Oral Given 02/13/16 1930)     Initial Impression / Assessment and Plan / ED Course  I have reviewed the triage vital signs and the nursing notes.  Pertinent imaging results that were available during my care of the patient were reviewed by me and considered in my medical decision making (see chart for details).  Clinical Course    Patient reports significant improvement with neb treatment.  Albuterol Inhaler given prior to d/c.  Final Clinical Impressions(s) / ED Diagnoses   Final diagnoses:  Bronchitis  Right acute serous otitis media, recurrence not specified   Pt symptoms consistent with bronchitis. CXR negative for acute infiltrate. Pt will be discharged with symptomatic treatment. Discussed return precautions. Pt is hemodynamically stable & in NAD prior to discharge.  New Prescriptions Discharge Medication List as of 02/13/2016  7:13 PM    START taking these medications   Details  azithromycin (ZITHROMAX) 250 MG tablet Take first 2 tablets together, then 1 every day until finished., Print    benzonatate (TESSALON) 100 MG capsule Take 1 capsule (100 mg total) by mouth every 8 (eight) hours., Starting Mon 02/13/2016, Print      I personally performed the services described in this documentation, which was scribed in my presence. The recorded information has been reviewed and is accurate.      [address]Hope Mill CreekM Terrie Haring, NP 02/14/16 0225    Loren Raceravid Yelverton, MD 02/18/16 [phone]

## 2016-02-21 ENCOUNTER — Encounter (HOSPITAL_COMMUNITY): Payer: Self-pay

## 2016-02-21 ENCOUNTER — Emergency Department (HOSPITAL_COMMUNITY)
Admission: EM | Admit: 2016-02-21 | Discharge: 2016-02-21 | Disposition: A | Discharge status: Home / Self Care | Payer: Self-pay | Attending: Emergency Medicine | Admitting: Emergency Medicine

## 2016-02-21 DIAGNOSIS — H6693 Otitis media, unspecified, bilateral: Secondary | ICD-10-CM | POA: Insufficient documentation

## 2016-02-21 DIAGNOSIS — J0100 Acute maxillary sinusitis, unspecified: Secondary | ICD-10-CM | POA: Insufficient documentation

## 2016-02-21 DIAGNOSIS — I10 Essential (primary) hypertension: Secondary | ICD-10-CM | POA: Insufficient documentation

## 2016-02-21 DIAGNOSIS — Z7984 Long term (current) use of oral hypoglycemic drugs: Secondary | ICD-10-CM | POA: Insufficient documentation

## 2016-02-21 DIAGNOSIS — J0110 Acute frontal sinusitis, unspecified: Secondary | ICD-10-CM | POA: Insufficient documentation

## 2016-02-21 DIAGNOSIS — Z79899 Other long term (current) drug therapy: Secondary | ICD-10-CM | POA: Insufficient documentation

## 2016-02-21 DIAGNOSIS — J45909 Unspecified asthma, uncomplicated: Secondary | ICD-10-CM | POA: Insufficient documentation

## 2016-02-21 DIAGNOSIS — E038 Other specified hypothyroidism: Secondary | ICD-10-CM | POA: Insufficient documentation

## 2016-02-21 MED ORDER — TRAMADOL HCL 50 MG PO TABS: 50.0000 mg | ORAL_TABLET | Freq: Four times a day (QID) | ORAL | 0 refills | Status: DC | PRN

## 2016-02-21 MED ORDER — FLUCONAZOLE 100 MG PO TABS
200.0000 mg | ORAL_TABLET | Freq: Once | ORAL | Status: AC
  Administered 2016-02-21: 200 mg via ORAL
  Filled 2016-02-21: qty 2

## 2016-02-21 MED ORDER — AMOXICILLIN-POT CLAVULANATE 875-125 MG PO TABS: 1.0000 | ORAL_TABLET | Freq: Two times a day (BID) | ORAL | 0 refills | Status: DC

## 2016-02-21 NOTE — ED Notes (Signed)
Patient requesting diflucan, reporting hx of yeast infections after taking augmentin in the past. [facility].

## 2016-02-21 NOTE — ED Notes (Signed)
ED Provider at bedside. 

## 2016-02-21 NOTE — ED Provider Notes (Signed)
[facility] Provider Note   CSN: [id number] Arrival date & time: 02/21/16  1753 By signing my name below, I, Ashlee Herman, attest that this documentation has been prepared under the direction and in the presence of [facility] Alekxander Isola, [place]. Electronically Signed: Bridgette HabermannMaria Herman, ED Scribe. 02/21/16. 6:27 PM.  History   Chief Complaint Chief Complaint  Patient presents with  . Otalgia   HPI Comments: Ashlee Herman is a 25 y.o. female with h/o asthma, DM, and HTN who presents to the Emergency Department complaining of right ear pain onset ~1.5 weeks ago. She was seen here one week ago for her ear pain and a cough and diagnosed with an ear infection and bronchitis. She was discharged and prescribed Zithromax which caused her pain to subside temporarily but it has since increased again.  Pt is also complaining of nasal congestion with yellow mucous beginning a couple days ago. Pt has been taking Sudafed, Advil, and Ibuprofen with no relief to her symptoms. Pt denies fever, chills, or any other associated symptoms.   The history is provided by the patient. No language interpreter was used.    Past Medical History:  Diagnosis Date  . Asthma 11/2009  . Diabetes mellitus   . Dyspepsia   . Fainting episodes   . Goiter   . H/O dysmenorrhea 11/2009  . H/O: menorrhagia 11/2009  . Hypertension   . Hypothyroid   . Obesity   . Thyroid activity decreased   . Thyroiditis, autoimmune     Patient Active Problem List   Diagnosis Date Noted  . Prediabetes   . Obesity   . Goiter   . Hypertension   . Thyroiditis, autoimmune   . Dyspepsia   . Hypothyroid   . Fainting episodes   . Pre-diabetes 06/12/2010  . Other specified acquired hypothyroidism 06/12/2010    Past Surgical History:  Procedure Laterality Date  . CLEFT PALATE REPAIR    . TYMPANOSTOMY TUBE PLACEMENT      OB History    No data available       Home Medications    Prior to Admission medications   Medication Sig Start Date  End Date Taking? Authorizing Provider  albuterol (PROVENTIL) (2.5 MG/3ML) 0.083% nebulizer solution Take 3 mLs (2.5 mg total) by nebulization every 6 (six) hours as needed for wheezing. Patient not taking: Reported on 02/13/2016 12/25/11   Benjiman CoreNathan Pickering, MD  amoxicillin-clavulanate (AUGMENTIN) 875-125 MG tablet Take 1 tablet by mouth 2 (two) times daily. 02/21/16   Rashaun Wichert Orlene OchM Suzanne Garbers, NP  benzonatate (TESSALON) 100 MG capsule Take 1 capsule (100 mg total) by mouth every 8 (eight) hours. 02/13/16   Dajah Fischman Orlene OchM Ricquel Foulk, NP  fluconazole (DIFLUCAN) 200 MG tablet Take the tablet PO once 02/13/16   Janne NapoleonHope M Anthoney Sheppard, NP  levothyroxine (SYNTHROID, LEVOTHROID) 25 MCG tablet Take 25 mcg by mouth daily. Brand Name Synthroid only Patient not taking: Reported on 02/13/2016 12/14/11   David StallMichael J Brennan, MD  metFORMIN (GLUCOPHAGE) 500 MG tablet Take 1 tablet (500 mg total) by mouth 2 (two) times daily with a meal. Patient not taking: Reported on 02/13/2016 09/18/11   Dessa PhiJennifer Badik, MD  SUMAtriptan (IMITREX) 100 MG tablet Take 1 tablet (100 mg total) by mouth every 2 (two) hours as needed for migraine. May repeat in 2 hours if headache persists or recurs. MAXIMUM 2 DOSES IN 24 HOURS Patient not taking: Reported on 02/13/2016 12/05/12   Elpidio AnisShari Upstill, PA-C  traMADol (ULTRAM) 50 MG tablet Take 1 tablet (50 mg  total) by mouth every 6 (six) hours as needed. 02/21/16   Nikie Cid Orlene Och, NP    Family History Family History  Problem Relation Age of Onset  . Obesity Mother   . Hypertension Mother   . Obesity Father   . Cancer Maternal Grandmother   . Thyroid disease Neg Hx     Social History Social History  Substance Use Topics  . Smoking status: Never Smoker  . Smokeless tobacco: Never Used  . Alcohol use No     Allergies   Patient has no known allergies.   Review of Systems Review of Systems  Constitutional: Negative for chills and fever.  HENT: Positive for congestion, ear pain, sinus pain and sinus pressure.     All other systems reviewed and are negative.    Physical Exam Updated Vital Signs BP 128/90 (BP Location: Left Arm)   Pulse 87   Temp 97.9 F (36.6 C) (Oral)   Resp 17   Ht 5\' 6"  (1.676 m)   Wt 114.3 kg   LMP 02/07/2016   SpO2 98%   BMI 40.67 kg/m   Physical Exam  Constitutional: She is oriented to person, place, and time. She appears well-developed and well-nourished.  HENT:  Head: Normocephalic.  Right Ear: No mastoid tenderness. Tympanic membrane is scarred and erythematous.  Left Ear: No mastoid tenderness. Tympanic membrane is scarred and erythematous.  Nose: Right sinus exhibits frontal sinus tenderness. Left sinus exhibits frontal sinus tenderness.  Mouth/Throat: Uvula is midline. No posterior oropharyngeal edema or posterior oropharyngeal erythema.  Eyes: Conjunctivae and EOM are normal.  Neck: Normal range of motion. Neck supple.  Cardiovascular: Normal rate and regular rhythm.   Pulmonary/Chest: Effort normal and breath sounds normal.  Abdominal: She exhibits no distension.  Musculoskeletal: Normal range of motion.  Lymphadenopathy:    She has no cervical adenopathy.  Neurological: She is alert and oriented to person, place, and time.  Skin: Skin is warm and dry.  Psychiatric: She has a normal mood and affect. Her behavior is normal.  Nursing note and vitals reviewed.  ED Treatments / Results  DIAGNOSTIC STUDIES: Oxygen Saturation is 98% on RA, normal by my interpretation.    COORDINATION OF CARE: 6:26 PM Discussed treatment plan with pt at bedside which includes ENT referral and pt agreed to plan.  Labs (all labs ordered are listed, but only abnormal results are displayed) Labs Reviewed - No data to display  Radiology No results found.  Procedures Procedures (including critical care time)  Medications Ordered in ED Medications  fluconazole (DIFLUCAN) tablet 200 mg (200 mg Oral Given 02/21/16 1841)     Initial Impression / Assessment and Plan  / ED Course  I have reviewed the triage vital signs and the nursing notes.   Clinical Course   25 y.o. female with ear ache and sinus pain stable for d/c without fever and does not appear toxic. Will treat with Augmentin and patient will f/u with ENT. Final Clinical Impressions(s) / ED Diagnoses   Final diagnoses:  Acute otitis media, bilateral  Acute maxillary sinusitis, recurrence not specified    New Prescriptions Discharge Medication List as of 02/21/2016  6:31 PM    START taking these medications   Details  amoxicillin-clavulanate (AUGMENTIN) 875-125 MG tablet Take 1 tablet by mouth 2 (two) times daily., Starting Tue 02/21/2016, Print    traMADol (ULTRAM) 50 MG tablet Take 1 tablet (50 mg total) by mouth every 6 (six) hours as needed., Starting Tue 02/21/2016, Print  I personally performed the services described in this documentation, which was scribed in my presence. The recorded information has been reviewed and is accurate.     [address] Lake RidgeM Zeda Gangwer, NP 02/22/16 0301    Shaune Pollackameron Isaacs, MD 02/22/16 [phone]

## 2016-02-21 NOTE — ED Triage Notes (Signed)
Per Pt, Pt is coming from home with complaints of right ear pain. Pt was diagnosed with ear infection one week ago. Pain subsided and then increased again. Pt report nasal congestion with yellow mucous. Denies fevers

## 2016-03-08 DIAGNOSIS — N920 Excessive and frequent menstruation with regular cycle: Secondary | ICD-10-CM | POA: Diagnosis not present

## 2016-03-08 DIAGNOSIS — Z3040 Encounter for surveillance of contraceptives, unspecified: Secondary | ICD-10-CM | POA: Diagnosis not present

## 2016-03-08 DIAGNOSIS — Z113 Encounter for screening for infections with a predominantly sexual mode of transmission: Secondary | ICD-10-CM | POA: Diagnosis not present

## 2016-03-08 DIAGNOSIS — G43909 Migraine, unspecified, not intractable, without status migrainosus: Secondary | ICD-10-CM | POA: Diagnosis not present

## 2016-03-08 DIAGNOSIS — N926 Irregular menstruation, unspecified: Secondary | ICD-10-CM | POA: Diagnosis not present

## 2016-03-08 DIAGNOSIS — Z23 Encounter for immunization: Secondary | ICD-10-CM | POA: Diagnosis not present

## 2016-03-08 DIAGNOSIS — Z01419 Encounter for gynecological examination (general) (routine) without abnormal findings: Secondary | ICD-10-CM | POA: Diagnosis not present

## 2016-03-08 DIAGNOSIS — Z124 Encounter for screening for malignant neoplasm of cervix: Secondary | ICD-10-CM | POA: Diagnosis not present

## 2016-03-08 DIAGNOSIS — N946 Dysmenorrhea, unspecified: Secondary | ICD-10-CM | POA: Diagnosis not present

## 2016-03-19 ENCOUNTER — Encounter: Payer: Self-pay | Admitting: Family Medicine

## 2016-03-19 ENCOUNTER — Ambulatory Visit (INDEPENDENT_AMBULATORY_CARE_PROVIDER_SITE_OTHER): Payer: BLUE CROSS/BLUE SHIELD | Admitting: Family Medicine

## 2016-03-19 ENCOUNTER — Other Ambulatory Visit: Payer: Self-pay | Admitting: Family Medicine

## 2016-03-19 VITALS — BP 124/82 | HR 67 | Temp 98.0°F | Wt 253.0 lb

## 2016-03-19 DIAGNOSIS — R7303 Prediabetes: Secondary | ICD-10-CM | POA: Diagnosis not present

## 2016-03-19 DIAGNOSIS — E039 Hypothyroidism, unspecified: Secondary | ICD-10-CM

## 2016-03-19 DIAGNOSIS — J4530 Mild persistent asthma, uncomplicated: Secondary | ICD-10-CM | POA: Diagnosis not present

## 2016-03-19 LAB — POCT GLYCOSYLATED HEMOGLOBIN (HGB A1C): Hemoglobin A1C: 5.3

## 2016-03-19 MED ORDER — METFORMIN HCL 500 MG PO TABS: 500.0000 mg | ORAL_TABLET | Freq: Two times a day (BID) | ORAL | 6 refills | Status: DC

## 2016-03-19 MED ORDER — ALBUTEROL SULFATE HFA 108 (90 BASE) MCG/ACT IN AERS: 1.0000 | INHALATION_SPRAY | Freq: Four times a day (QID) | RESPIRATORY_TRACT | 2 refills | Status: DC | PRN

## 2016-03-19 NOTE — Assessment & Plan Note (Signed)
  Well controlled with as needed albuterol  -refilled albuterol inhaler today -follow up in 3 months

## 2016-03-19 NOTE — Progress Notes (Signed)
Subjective:    Patient ID: Ashlee Herman, female    DOB: 04-01-1990, 26 y.o.   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 No tobacco or drug use. Social alcohol use. Sexually active with female partner FH- HTN and DM in both mother and father  Asthma- uses inhaler daily, gets out of breath with moving around a lot. Denies coughing. No night-time awakenings. Uses inhaler maybe every 8 hours. Does not use nebulizer machine. Has harder time with colds due to asthma but over well controlled  T2DM- had seen Dr. Fransico Michael in 2012-13. Was on Metformin 500 BID. Has not taken this since 2013 because she had no insurance until now  Hypothyroid- was treated by Dr. Fransico Michael in 2012 with daily. Has not taken since 2013. Blames weight gain on college, inactivity. Denies constipation, cold intolerance, hair loss. Does have dry skin. No edema.   Objective:  BP 124/82   Pulse 67   Temp 98 F (36.7 C) (Oral)   Wt 253 lb (114.8 kg)   LMP 02/08/2016   SpO2 98%   BMI 40.84 kg/m  Vitals and nursing note reviewed  General: overweight female in no acute distress HEENT: normocephalic, no nasal discharge, moist mucous membranes, two small holes in roof of palate. Good dentition without erythema or discharge noted in posterior oropharynx Neck: supple, non-tender, without lymphadenopathy. Tender anterior neck with mild swelling of thyroid. Acanthosis nigricans noted.  Cardiac: RRR, clear S1 and S2, no murmurs, rubs, or gallops Respiratory: clear to auscultation bilaterally, no increased work of breathing Abdomen: obese abdomen. Soft, nontender, nondistended, hypoactive bowel sounds. Extremities: no edema or cyanosis.    Skin: warm and dry, no rashes noted Neuro: alert and oriented, no focal deficits. 2/4 reflexes at L4 bilaterally.   Assessment & Plan:    Mild persistent asthma without complication  Well controlled with as needed albuterol  -refilled albuterol inhaler today -follow up in 3 months  Hypothyroid  Hx of hypothyroidism previously on 25 mcg synthroid in 2013.   -check TSH today -follow up based on results, may need to be seen in 4-6 weeks if abnormal -pending results will restart synthroid if needed  Prediabetes  Most recent A1C 2013 was 5.5, previously on 500 BID metformin  -check A1C today, value 5.3 -restart metformin 500mg  BID -follow up in 2-3 months    Return in about 3 months (around 06/17/2016), or if symptoms worsen or fail to improve.   Dolores Patty, DO Family Medicine Resident PGY-1

## 2016-03-19 NOTE — Patient Instructions (Addendum)
  It was great meeting you!  Your A1C was 5.3 today. I sent Metformin to your pharmacy.  I'll let you know once I get your thyroid labs back.   Please follow up in 3 months.   Diet Recommendations for Diabetes   Starchy (carb) foods: Bread, rice, pasta, potatoes, corn, cereal, grits, crackers, bagels, muffins, all baked goods.  (Fruits, milk, and yogurt also have carbohydrate, but most of these foods will not spike your blood sugar as the starchy foods will.)  A few fruits do cause high blood sugars; use small portions of bananas (limit to 1/2 at a time), grapes, watermelon, oranges, and most tropical fruits.    Protein foods: Meat, fish, poultry, eggs, dairy foods, and beans such as pinto and kidney beans (beans also provide carbohydrate).   1. Eat at least 3 meals and 1-2 snacks per day. Never go more than 4-5 hours while awake without eating. Eat breakfast within the first hour of getting up.    2. Limit starchy foods to TWO per meal and ONE per snack. ONE portion of a starchy  food is equal to the following:   - ONE slice of bread (or its equivalent, such as half of a hamburger bun).   - 1/2 cup of a "scoopable" starchy food such as potatoes or rice.   - 15 grams of carbohydrate as shown on food label.   3. Include at every meal: a protein food, a carb food, and vegetables and/or fruit.   - Obtain twice the volume of veg's as protein or carbohydrate foods for both lunch and dinner.   - Fresh or frozen veg's are best.   - Keep frozen veg's on hand for a quick vegetable serving.

## 2016-03-19 NOTE — Assessment & Plan Note (Addendum)
  Most recent A1C 2013 was 5.5, previously on 500 BID metformin  -check A1C today, value 5.3 -restart metformin 500mg  BID -counseled on diabetes diet, limiting starch/carbs -encouraged weight loss -follow up in 2-3 months

## 2016-03-19 NOTE — Assessment & Plan Note (Addendum)
  Hx of hypothyroidism previously on 25 mcg synthroid in 2013.   -check TSH today -follow up based on results, may need to be seen in 4-6 weeks if abnormal -pending results will restart synthroid if needed

## 2016-03-20 ENCOUNTER — Other Ambulatory Visit: Payer: Self-pay | Admitting: Family Medicine

## 2016-03-20 LAB — TSH: TSH: 4.61 mIU/L — ABNORMAL HIGH

## 2016-03-21 LAB — T3, FREE: T3, Free: 2.9 pg/mL (ref 2.3–4.2)

## 2016-03-21 LAB — T4, FREE: Free T4: 1.1 ng/dL (ref 0.8–1.8)

## 2016-03-23 ENCOUNTER — Telehealth: Payer: Self-pay | Admitting: Family Medicine

## 2016-03-23 DIAGNOSIS — E039 Hypothyroidism, unspecified: Secondary | ICD-10-CM

## 2016-03-23 MED ORDER — LEVOTHYROXINE SODIUM 25 MCG PO TABS: 25.0000 ug | ORAL_TABLET | Freq: Every day | ORAL | 1 refills | Status: DC

## 2016-03-23 NOTE — Telephone Encounter (Signed)
  Left message on patient's personal VM to call me back to discuss starting medication that we talked about at her visit. Sent medication to her pharmacy. Will need to recheck labs in 6 weeks.

## 2016-05-07 DIAGNOSIS — Z23 Encounter for immunization: Secondary | ICD-10-CM | POA: Diagnosis not present

## 2016-05-14 ENCOUNTER — Other Ambulatory Visit: Payer: Self-pay | Admitting: Family Medicine

## 2016-05-24 ENCOUNTER — Ambulatory Visit (INDEPENDENT_AMBULATORY_CARE_PROVIDER_SITE_OTHER): Payer: BLUE CROSS/BLUE SHIELD | Admitting: Family Medicine

## 2016-05-24 VITALS — BP 118/78 | HR 69 | Temp 98.1°F | Ht 66.0 in | Wt 249.0 lb

## 2016-05-24 DIAGNOSIS — R7303 Prediabetes: Secondary | ICD-10-CM

## 2016-05-24 DIAGNOSIS — E039 Hypothyroidism, unspecified: Secondary | ICD-10-CM | POA: Diagnosis not present

## 2016-05-24 DIAGNOSIS — G43119 Migraine with aura, intractable, without status migrainosus: Secondary | ICD-10-CM

## 2016-05-24 MED ORDER — SUMATRIPTAN SUCCINATE 50 MG PO TABS: 50.0000 mg | ORAL_TABLET | ORAL | 3 refills | Status: DC | PRN

## 2016-05-24 NOTE — Assessment & Plan Note (Signed)
  Has been having migraines for past year, has had 4 in past month one of which caused her to call out from work.  -rx given for sumatriptan to take when she gets aura indicating impending migraine -can repeat sumatriptan 2 hours after first dose if headache does not abate -continue excedrin as needed -if sumatriptan does not work for her will start daily topiramate, patient will call if headaches not controlled by triptan -if headaches worsen or neurologic symptoms develop patient advised to go to ED or will order head imaging

## 2016-05-24 NOTE — Assessment & Plan Note (Signed)
  Doing well on synthroid, goiter appears improved, no symptoms of high or low thyroid  -check TSH today -continue low dose synthroid -will call patient with results if any changes need to be made

## 2016-05-24 NOTE — Assessment & Plan Note (Signed)
  Doing well on metformin  -continue 500 mg metformin daily -recheck A1C in 3 months

## 2016-05-24 NOTE — Progress Notes (Signed)
Subjective:    Patient ID: Ashlee Herman, female    DOB: 01/10/1991, 26 y.o.   MRN: [id number]   CC: migraines  Migraines- Feb 14, 19, March 5-7th had to call out of work. On March 20th woke her up from sleep. Excedrin has not been working for her. When it wears off. Can tell when she will get one because the back of her neck will start throbbing and get hot and the pain will settle in temple/foreheads, eyes will start to throb. Photophobia. Has been getting migraines for the past year. OB/Gyn had put her on BC to avoid headaches with periods but now the headaches occur outside of menstrual cycle. Going in dark room and sleeping helps. Has nausea. Has thrown up 3 times. So over past month has had 4 migraines, one bad enough to miss work. Reports normal sleep schedule and normal eating habits. Maternal Aunt has severe migraines with similar symptoms. Did not have them when younger. Reports stress with parents both being sick and caring for them. Father has been having syncopal events. Mom bedridden.   Asthma has been well controlled- does not require inhaler unless she is sick with cold. Denies coughing. Doing well on metformin, has not lost any weight but is tolerating this medication better. Doing well on Synthroid. Feels her throat appears less swollen. Denies constipation or diarrhea. Denies fatigue, cold intolerance, hair loss. Does endorse dry skin.  Smoking status reviewed- no smoking Allg- oranges- rash  Review of Systems- see HPI  Objective:  BP 118/78   Pulse 69   Temp 98.1 F (36.7 C) (Oral)   Ht 5\' 6"  (1.676 m)   Wt 249 lb (112.9 kg)   LMP 04/29/2016 (Exact Date)   SpO2 97%   BMI 40.19 kg/m  Vitals and nursing note reviewed  General: well nourished, in no acute distress HEENT: normocephalic,no scleral icterus or conjunctival pallor, no nasal discharge, moist mucous membranes, Neck: supple, full ROM. Tender to palpation along posterior neck. No LAD. Palpable goiter,  non-tender. Cardiac: RRR, clear S1 and S2, no murmurs, rubs, or gallops Respiratory: clear to auscultation bilaterally, no increased work of breathing Abdomen: soft, nontender, nondistended, no masses or organomegaly. Bowel sounds present Extremities: no edema or cyanosis. Warm, well perfused. 2+ radial pulses. Skin: warm and dry, no rashes noted Neuro: alert and oriented, no focal deficits. Strength 5/5 throughout. Sensation in tact. Normal gait.   Assessment & Plan:    Hypothyroid  Doing well on synthroid, goiter appears improved, no symptoms of high or low thyroid  -check TSH today -continue low dose synthroid -will call patient with results if any changes need to be made  Intractable migraine with aura without status migrainosus  Has been having migraines for past year, has had 4 in past month one of which caused her to call out from work.  -rx given for sumatriptan to take when she gets aura indicating impending migraine -can repeat sumatriptan 2 hours after first dose if headache does not abate -continue excedrin as needed -if sumatriptan does not work for her will start daily topiramate, patient will call if headaches not controlled by triptan -if headaches worsen or neurologic symptoms develop patient advised to go to ED or will order head imaging  Pre-diabetes  Doing well on metformin  -continue 500 mg metformin daily -recheck A1C in 3 months   Return in about 3 months (around 08/24/2016).   Dolores PattyAngela Madyson Lukach, DO Family Medicine Resident PGY-1

## 2016-05-24 NOTE — Patient Instructions (Signed)
Migraine Headache A migraine headache is an intense, throbbing pain on one side or both sides of the head. Migraines may also cause other symptoms, such as nausea, vomiting, and sensitivity to light and noise. What are the causes? Doing or taking certain things may also trigger migraines, such as:  Alcohol.  Smoking.  Medicines, such as:  Medicine used to treat chest pain (nitroglycerine).  Birth control pills.  Estrogen pills.  Certain blood pressure medicines.  Aged cheeses, chocolate, or caffeine.  Foods or drinks that contain nitrates, glutamate, aspartame, or tyramine.  Physical activity. Other things that may trigger a migraine include:  Menstruation.  Pregnancy.  Hunger.  Stress, lack of sleep, too much sleep, or fatigue.  Weather changes. What increases the risk? The following factors may make you more likely to experience migraine headaches:  Age. Risk increases with age.  Family history of migraine headaches.  Being Caucasian.  Depression and anxiety.  Obesity.  Being a woman.  Having a hole in the heart (patent foramen ovale) or other heart problems. What are the signs or symptoms? The main symptom of this condition is pulsating or throbbing pain. Pain may:  Happen in any area of the head, such as on one side or both sides.  Interfere with daily activities.  Get worse with physical activity.  Get worse with exposure to bright lights or loud noises. Other symptoms may include:  Nausea.  Vomiting.  Dizziness.  General sensitivity to bright lights, loud noises, or smells. Before you get a migraine, you may get warning signs that a migraine is developing (aura). An aura may include:  Seeing flashing lights or having blind spots.  Seeing bright spots, halos, or zigzag lines.  Having tunnel vision or blurred vision.  Having numbness or a tingling feeling.  Having trouble talking.  Having muscle weakness. How is this diagnosed? A  migraine headache can be diagnosed based on:  Your symptoms.  A physical exam.  Tests, such as CT scan or MRI of the head. These imaging tests can help rule out other causes of headaches.  Taking fluid from the spine (lumbar puncture) and analyzing it (cerebrospinal fluid analysis, or CSF analysis). How is this treated? A migraine headache is usually treated with medicines that:  Relieve pain.  Relieve nausea.  Prevent migraines from coming back. Treatment may also include:  Acupuncture.  Lifestyle changes like avoiding foods that trigger migraines. Follow these instructions at home: Medicines   Take over-the-counter and prescription medicines only as told by your health care provider.  Do not drive or use heavy machinery while taking prescription pain medicine.  To prevent or treat constipation while you are taking prescription pain medicine, your health care provider may recommend that you:  Drink enough fluid to keep your urine clear or pale yellow.  Take over-the-counter or prescription medicines.  Eat foods that are high in fiber, such as fresh fruits and vegetables, whole grains, and beans.  Limit foods that are high in fat and processed sugars, such as fried and sweet foods. Lifestyle   Avoid alcohol use.  Do not use any products that contain nicotine or tobacco, such as cigarettes and e-cigarettes. If you need help quitting, ask your health care provider.  Get at least 8 hours of sleep every night.  Limit your stress. General instructions    Keep a journal to find out what may trigger your migraine headaches. For example, write down:  What you eat and drink.  How much sleep you  get.  Any change to your diet or medicines.  If you have a migraine:  Avoid things that make your symptoms worse, such as bright lights.  It may help to lie down in a dark, quiet room.  Do not drive or use heavy machinery.  Ask your health care provider what activities  are safe for you while you are experiencing symptoms.  Keep all follow-up visits as told by your health care provider. This is important. Contact a health care provider if:  You develop symptoms that are different or more severe than your usual migraine symptoms. Get help right away if:  Your migraine becomes severe.  You have a fever.  You have a stiff neck.  You have vision loss.  Your muscles feel weak or like you cannot control them.  You start to lose your balance often.  You develop trouble walking.  You faint. This information is not intended to replace advice given to you by your health care provider. Make sure you discuss any questions you have with your health care provider.

## 2016-05-25 LAB — TSH: TSH: 1.69 u[IU]/mL (ref 0.450–4.500)

## 2016-05-28 ENCOUNTER — Telehealth: Payer: Self-pay | Admitting: *Deleted

## 2016-05-28 NOTE — Telephone Encounter (Signed)
-----   Message from Tillman SersAngela C Riccio, DO sent at 05/25/2016  3:14 PM EDT -----  Please call Ms. Min to inform her the TSH level is normal. We will continue Synthroid at current dose and recheck in 3 months.

## 2016-05-28 NOTE — Telephone Encounter (Signed)
Spoke with patient and made her aware of results and when she is due for a follow up appointment. Jazmin Hartsell,CMA

## 2016-07-02 LAB — HM DIABETES EYE EXAM

## 2016-07-10 ENCOUNTER — Other Ambulatory Visit: Payer: Self-pay | Admitting: Family Medicine

## 2016-09-10 DIAGNOSIS — Z23 Encounter for immunization: Secondary | ICD-10-CM | POA: Diagnosis not present

## 2016-09-19 DIAGNOSIS — M76822 Posterior tibial tendinitis, left leg: Secondary | ICD-10-CM | POA: Diagnosis not present

## 2016-10-01 DIAGNOSIS — B373 Candidiasis of vulva and vagina: Secondary | ICD-10-CM | POA: Diagnosis not present

## 2016-10-01 DIAGNOSIS — R350 Frequency of micturition: Secondary | ICD-10-CM | POA: Diagnosis not present

## 2016-10-01 DIAGNOSIS — R102 Pelvic and perineal pain: Secondary | ICD-10-CM | POA: Diagnosis not present

## 2016-10-01 DIAGNOSIS — Z3040 Encounter for surveillance of contraceptives, unspecified: Secondary | ICD-10-CM | POA: Diagnosis not present

## 2016-10-23 ENCOUNTER — Encounter: Payer: Self-pay | Admitting: Family Medicine

## 2016-10-23 ENCOUNTER — Ambulatory Visit (INDEPENDENT_AMBULATORY_CARE_PROVIDER_SITE_OTHER): Payer: BLUE CROSS/BLUE SHIELD | Admitting: Family Medicine

## 2016-10-23 VITALS — BP 118/80 | HR 91 | Temp 98.5°F | Wt 245.0 lb

## 2016-10-23 DIAGNOSIS — J0100 Acute maxillary sinusitis, unspecified: Secondary | ICD-10-CM

## 2016-10-23 MED ORDER — AMOXICILLIN-POT CLAVULANATE 875-125 MG PO TABS: 1.0000 | ORAL_TABLET | Freq: Two times a day (BID) | ORAL | 0 refills | Status: AC

## 2016-10-23 MED ORDER — BENZONATATE 100 MG PO CAPS: 100.0000 mg | ORAL_CAPSULE | Freq: Two times a day (BID) | ORAL | 0 refills | Status: DC | PRN

## 2016-10-23 MED ORDER — FLUCONAZOLE 150 MG PO TABS: 150.0000 mg | ORAL_TABLET | Freq: Once | ORAL | 0 refills | Status: DC

## 2016-10-23 NOTE — Patient Instructions (Signed)
Please take augmentin twice a day for a total of 7 days. You can take tessalon perles for cough as needed.    Sinusitis, Adult Sinusitis is soreness and inflammation of your sinuses. Sinuses are hollow spaces in the bones around your face. Your sinuses are located:  Around your eyes.  In the middle of your forehead.  Behind your nose.  In your cheekbones.  Your sinuses and nasal passages are lined with a stringy fluid (mucus). Mucus normally drains out of your sinuses. When your nasal tissues become inflamed or swollen, the mucus can become trapped or blocked so air cannot flow through your sinuses. This allows bacteria, viruses, and funguses to grow, which leads to infection. Sinusitis can develop quickly and last for 7?10 days (acute) or for more than 12 weeks (chronic). Sinusitis often develops after a cold. What are the causes? This condition is caused by anything that creates swelling in the sinuses or stops mucus from draining, including:  Allergies.  Asthma.  Bacterial or viral infection.  Abnormally shaped bones between the nasal passages.  Nasal growths that contain mucus (nasal polyps).  Narrow sinus openings.  Pollutants, such as chemicals or irritants in the air.  A foreign object stuck in the nose.  A fungal infection. This is rare.  What increases the risk? The following factors may make you more likely to develop this condition:  Having allergies or asthma.  Having had a recent cold or respiratory tract infection.  Having structural deformities or blockages in your nose or sinuses.  Having a weak immune system.  Doing a lot of swimming or diving.  Overusing nasal sprays.  Smoking.  What are the signs or symptoms? The main symptoms of this condition are pain and a feeling of pressure around the affected sinuses. Other symptoms include:  Upper toothache.  Earache.  Headache.  Bad breath.  Decreased sense of smell and taste.  A cough that  may get worse at night.  Fatigue.  Fever.  Thick drainage from your nose. The drainage is often green and it may contain pus (purulent).  Stuffy nose or congestion.  Postnasal drip. This is when extra mucus collects in the throat or back of the nose.  Swelling and warmth over the affected sinuses.  Sore throat.  Sensitivity to light.  How is this diagnosed? This condition is diagnosed based on symptoms, a medical history, and a physical exam. To find out if your condition is acute or chronic, your health care provider may:  Look in your nose for signs of nasal polyps.  Tap over the affected sinus to check for signs of infection.  View the inside of your sinuses using an imaging device that has a light attached (endoscope).  If your health care provider suspects that you have chronic sinusitis, you may also:  Be tested for allergies.  Have a sample of mucus taken from your nose (nasal culture) and checked for bacteria.  Have a mucus sample examined to see if your sinusitis is related to an allergy.  If your sinusitis does not respond to treatment and it lasts longer than 8 weeks, you may have an MRI or CT scan to check your sinuses. These scans also help to determine how severe your infection is. In rare cases, a bone biopsy may be done to rule out more serious types of fungal sinus disease. How is this treated? Treatment for sinusitis depends on the cause and whether your condition is chronic or acute. If a virus  is causing your sinusitis, your symptoms will go away on their own within 10 days. You may be given medicines to relieve your symptoms, including:  Topical nasal decongestants. They shrink swollen nasal passages and let mucus drain from your sinuses.  Antihistamines. These drugs block inflammation that is triggered by allergies. This can help to ease swelling in your nose and sinuses.  Topical nasal corticosteroids. These are nasal sprays that ease inflammation  and swelling in your nose and sinuses.  Nasal saline washes. These rinses can help to get rid of thick mucus in your nose.  If your condition is caused by bacteria, you will be given an antibiotic medicine. If your condition is caused by a fungus, you will be given an antifungal medicine. Surgery may be needed to correct underlying conditions, such as narrow nasal passages. Surgery may also be needed to remove polyps. Follow these instructions at home: Medicines  Take, use, or apply over-the-counter and prescription medicines only as told by your health care provider. These may include nasal sprays.  If you were prescribed an antibiotic medicine, take it as told by your health care provider. Do not stop taking the antibiotic even if you start to feel better. Hydrate and Humidify  Drink enough water to keep your urine clear or pale yellow. Staying hydrated will help to thin your mucus.  Use a cool mist humidifier to keep the humidity level in your home above 50%.  Inhale steam for 10-15 minutes, 3-4 times a day or as told by your health care provider. You can do this in the bathroom while a hot shower is running.  Limit your exposure to cool or dry air. Rest  Rest as much as possible.  Sleep with your head raised (elevated).  Make sure to get enough sleep each night. General instructions  Apply a warm, moist washcloth to your face 3-4 times a day or as told by your health care provider. This will help with discomfort.  Wash your hands often with soap and water to reduce your exposure to viruses and other germs. If soap and water are not available, use hand sanitizer.  Do not smoke. Avoid being around people who are smoking (secondhand smoke).  Keep all follow-up visits as told by your health care provider. This is important. Contact a health care provider if:  You have a fever.  Your symptoms get worse.  Your symptoms do not improve within 10 days. Get help right away  if:  You have a severe headache.  You have persistent vomiting.  You have pain or swelling around your face or eyes.  You have vision problems.  You develop confusion.  Your neck is stiff.  You have trouble breathing. This information is not intended to replace advice given to you by your health care provider. Make sure you discuss any questions you have with your health care provider. Document Released: 02/19/2005 Document Revised: 10/16/2015 Document Reviewed: 12/15/2014 Elsevier Interactive Patient Education  2017 [employer].

## 2016-10-23 NOTE — Progress Notes (Signed)
    Subjective:  Ashlee Herman is a 25 y.o. female who presents to the [facility] today with a chief complaint of cough  HPI:  - Started getting sick on Friday night with non productive cough, took allergra because thought was her allergies - On Saturday, started having hoarse voice, ear pain, phlegm in chest  - Felt a little better but now is worsening with productive cough with yellow phlegm. Chest is very sore. Shallow feeling when breathing, thought was acting up. Has been using her albuterol inhaler q4h, usually only needs occasionally as needed. 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 Oropharynx nonerythematous. CV: RRR with no murmurs appreciated Pulm: NWOB, CTAB with no crackles, wheezes, or rhonchi GI: Normal bowel sounds present. Soft, Nontender, Nondistended. MSK: no edema, cyanosis, or clubbing noted Skin: warm, dry Neuro: grossly normal, moves all extremities Psych: Normal affect and thought content  Assessment/Plan:  Acute bacterial sinusitis Likely sinusitis with maxillary tenderness and double worsening. Also on DDx is L AOM given ear findings. Will treat with augmentin for 7 days to cover both. Tessalon perles for symptomatic management of cough. Patient give diflucan because she states usually gets yeast infections after antibiotics.  [name], DO PGY-2, [facility] 10/23/2016 2:59 PM

## 2016-11-04 ENCOUNTER — Other Ambulatory Visit: Payer: Self-pay | Admitting: Family Medicine

## 2016-11-24 IMAGING — US US PELVIS COMPLETE
1 series · 13 of 25 positions shown · non-contrast
Comparison: None.

CLINICAL DATA: 23-year-old female with right lower quadrant pain
and vaginal discharge for 3 days. Dysfunctional uterine bleeding.
Initial encounter.

EXAM:
TRANSABDOMINAL AND TRANSVAGINAL ULTRASOUND OF PELVIS
DOPPLER ULTRASOUND OF OVARIES
TECHNIQUE: Both transabdominal and transvaginal ultrasound examinations of the
pelvis were performed. Transabdominal technique was performed for
global imaging of the pelvis including uterus, ovaries, adnexal
regions, and pelvic cul-de-sac.
It was necessary to proceed with endovaginal exam following the
transabdominal exam to visualize the ovaries. Color and duplex
Doppler ultrasound was utilized to evaluate blood flow to the
ovaries.

[Series 1: us pelvis complete · 0.27mm/px · 13 of 80 slices shown]
[im 1/80]
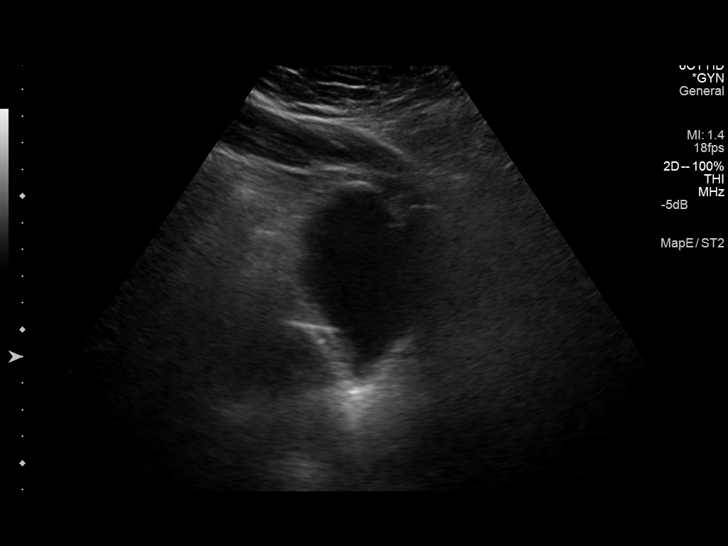
[im 7/80]
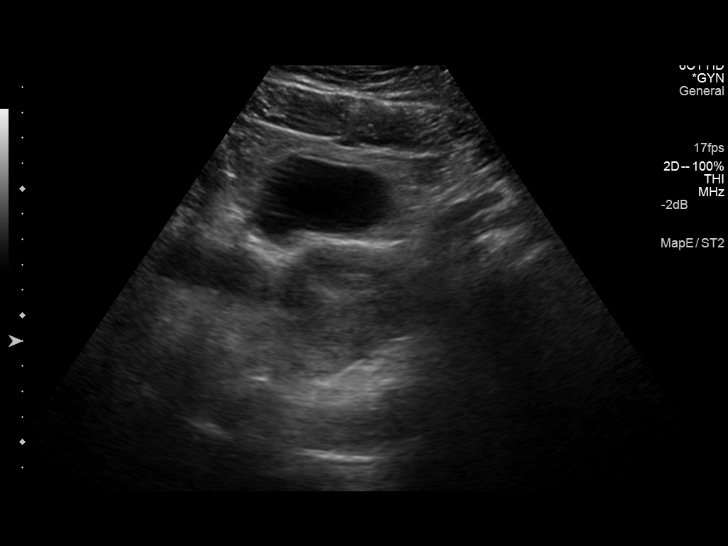
[im 14/80]
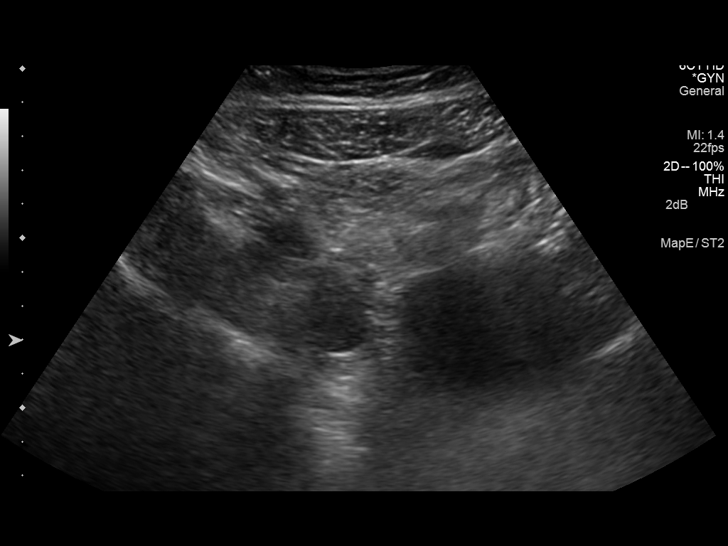
[im 20/80]
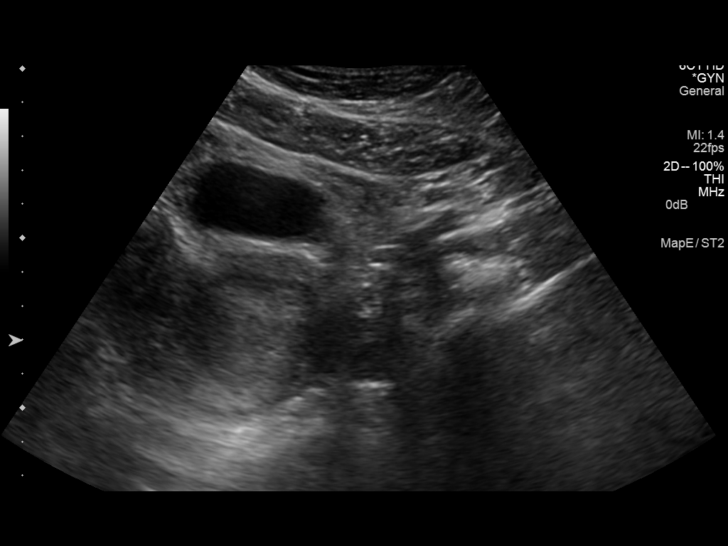
[im 27/80]
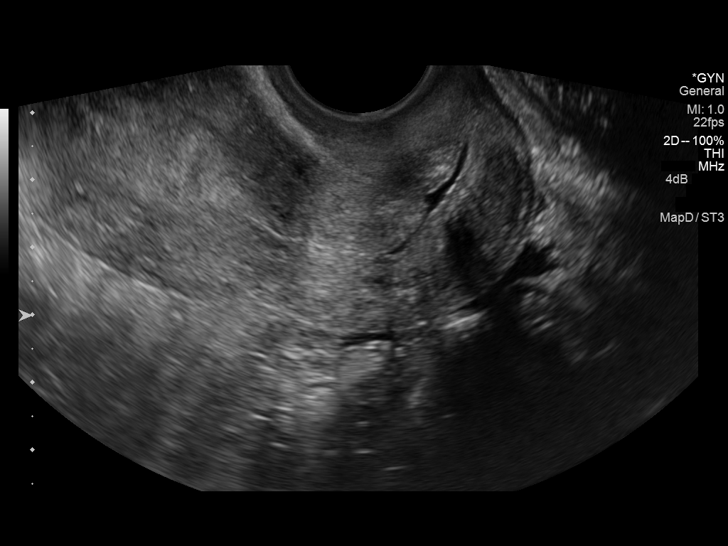
[im 33/80]
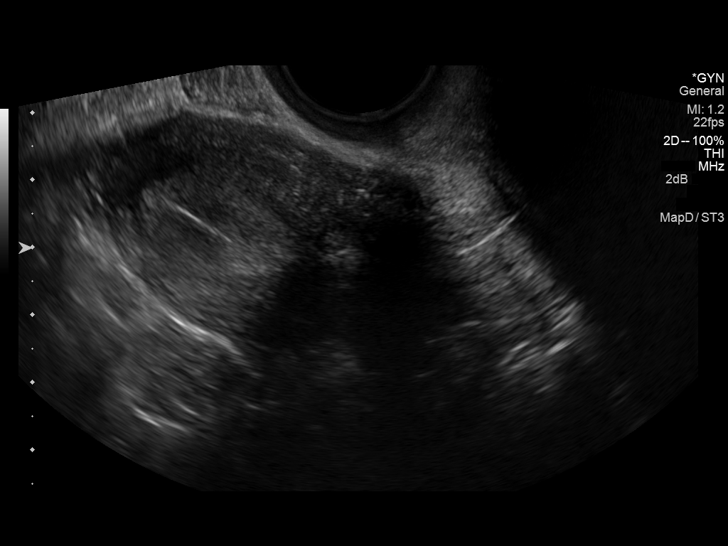
[im 40/80]
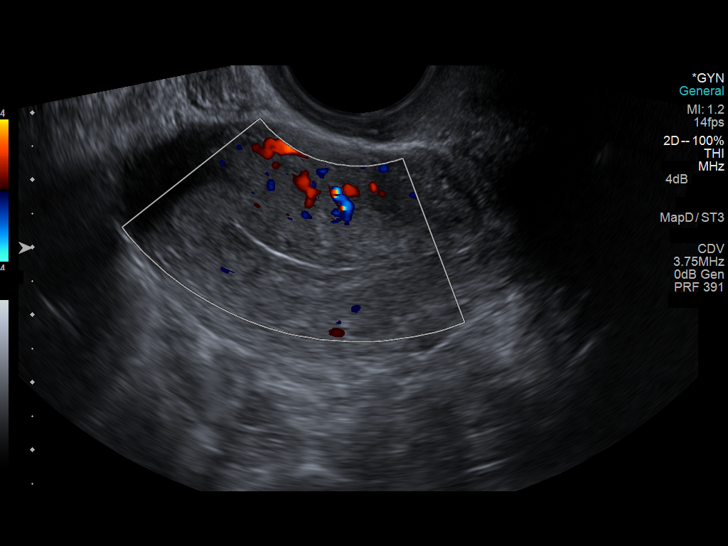
[im 47/80]
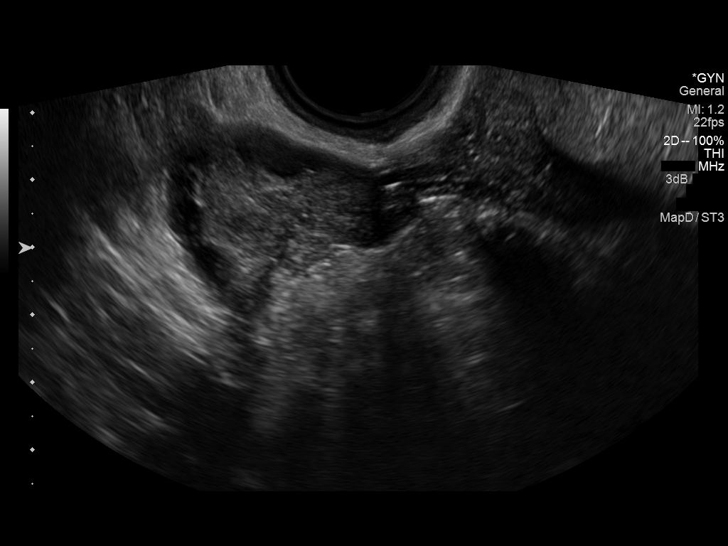
[im 53/80]
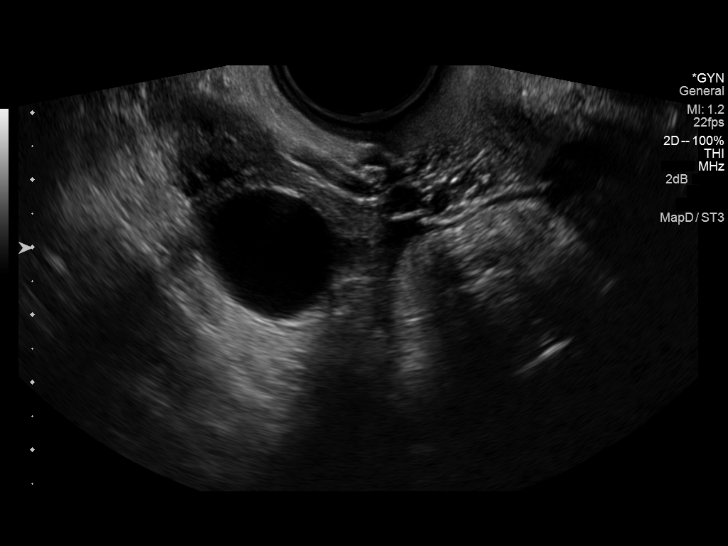
[im 60/80]
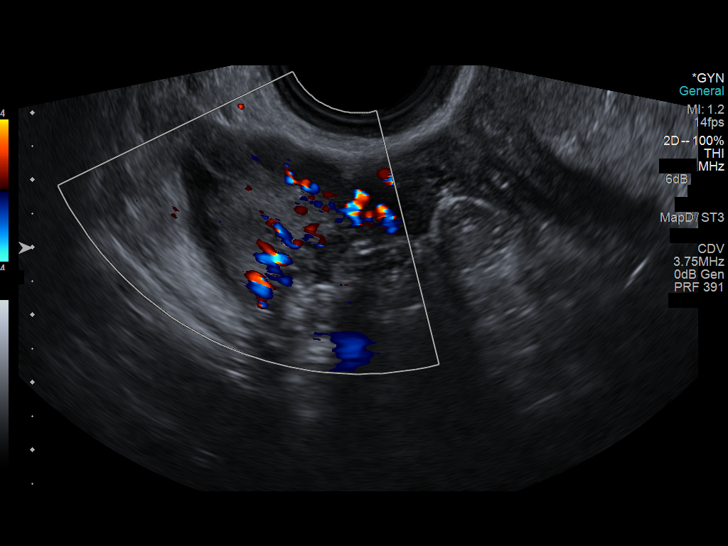
[im 66/80]
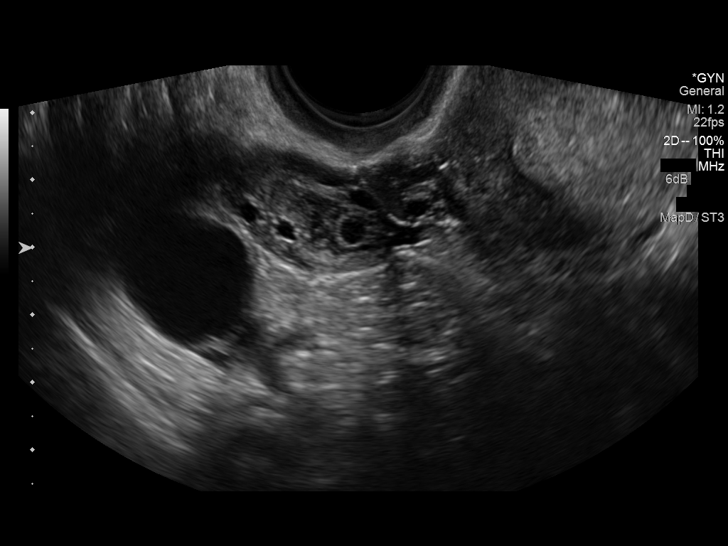
[im 73/80]
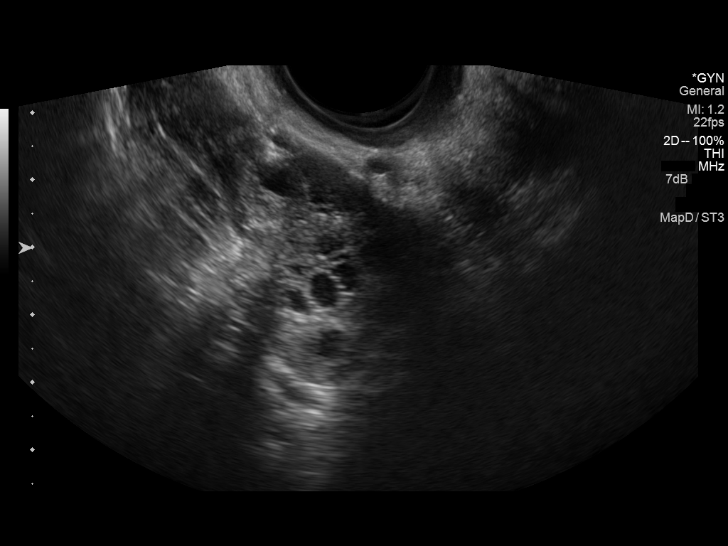
[im 80/80]
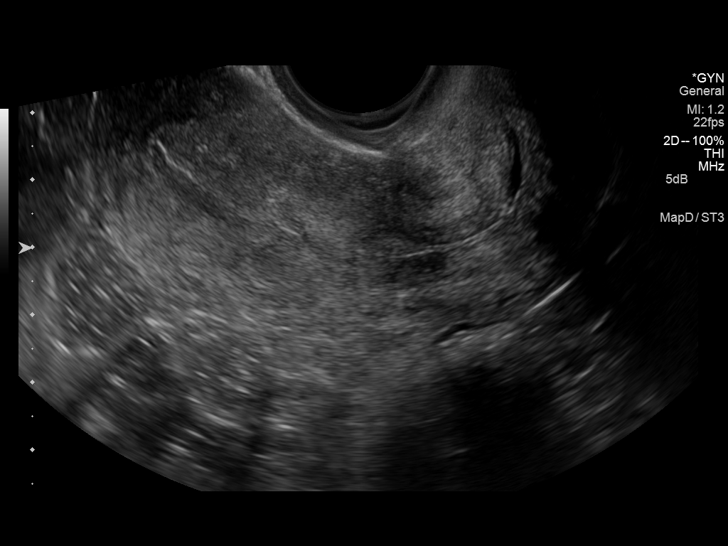

[13 of 25 positions shown; findings below may reference images not displayed]

FINDINGS: Uterus

Measurements: 8.1 x 3.5 x 5.0 cm. No fibroids or other mass
visualized.

Endometrium

Thickness: 15 mm. Homogeneous appearance of the endometrium but
there is fluid or blood within the endometrial canal and cervix
(image 28, 33, and cine clip 2).

Right ovary

Measurements: 4.3 x 2.5 x 3.7 cm. Follicles, the largest a simple
fluid 20 mm structure (image 51). Also appearance of an involuting
cyst or corpus luteum noted measuring 24 mm (image 57). No adnexal
mass.

Left ovary

Measurements: 3.5 x 2.9 x 1.7 cm. Multiple small follicles. Normal
appearance/no adnexal mass.

Pulsed Doppler evaluation of both ovaries demonstrates normal
low-resistance arterial and venous waveforms.

Other findings

Trace simple appearing free fluid.
IMPRESSION: No evidence of ovarian torsion. Normal for age sonographic
appearance of the pelvis aside from evidence of hemorrhage/fluid in
the endometrial canal and cervix.

## 2017-01-22 ENCOUNTER — Encounter: Payer: Self-pay | Admitting: Family Medicine

## 2017-01-22 ENCOUNTER — Ambulatory Visit: Payer: BLUE CROSS/BLUE SHIELD | Admitting: Family Medicine

## 2017-01-22 ENCOUNTER — Encounter: Payer: Self-pay | Admitting: Licensed Clinical Social Worker

## 2017-01-22 ENCOUNTER — Other Ambulatory Visit: Payer: Self-pay

## 2017-01-22 VITALS — BP 120/70 | HR 71 | Temp 97.9°F | Ht 66.0 in | Wt 253.0 lb

## 2017-01-22 DIAGNOSIS — E039 Hypothyroidism, unspecified: Secondary | ICD-10-CM

## 2017-01-22 DIAGNOSIS — R7303 Prediabetes: Secondary | ICD-10-CM

## 2017-01-22 DIAGNOSIS — F419 Anxiety disorder, unspecified: Secondary | ICD-10-CM | POA: Diagnosis not present

## 2017-01-22 LAB — POCT GLYCOSYLATED HEMOGLOBIN (HGB A1C): HEMOGLOBIN A1C: 5.4

## 2017-01-22 MED ORDER — ESCITALOPRAM OXALATE 10 MG PO TABS: 10.0000 mg | ORAL_TABLET | Freq: Every day | ORAL | 1 refills | Status: DC

## 2017-01-22 NOTE — Patient Instructions (Signed)
It was great seeing you today!  Please start taking Lexapro 10 mg daily. I'd like to see you back in 2 weeks to see how you're doing with this medication.  We'll check your thyroid function today to see where we're at.   If you have questions or concerns please do not hesitate to call at [phone].  Dolores PattyAngela Melodi Happel, DO PGY-2, [facility] 01/22/2017 3:03 PM    Living With Anxiety After being diagnosed with an anxiety disorder, you may be relieved to know why you have felt or behaved a certain way. It is natural to also feel overwhelmed about the treatment ahead and what it will mean for your life. With care and support, you can manage this condition and recover from it. How to cope with anxiety Dealing with stress Stress is your body's reaction to life changes and events, both good and bad. Stress can last just a few hours or it can be ongoing. Stress can play a major role in anxiety, so it is important to learn both how to cope with stress and how to think about it differently. Talk with your health care provider or a counselor to learn more about stress reduction. He or she may suggest some stress reduction techniques, such as:  Music therapy. This can include creating or listening to music that you enjoy and that inspires you.  Mindfulness-based meditation. This involves being aware of your normal breaths, rather than trying to control your breathing. It can be done while sitting or walking.  Centering prayer. This is a kind of meditation that involves focusing on a word, phrase, or sacred image that is meaningful to you and that brings you peace.  Deep breathing. To do this, expand your stomach and inhale slowly through your nose. Hold your breath for 3-5 seconds. Then exhale slowly, allowing your stomach muscles to relax.  Self-talk. This is a skill where you identify thought patterns that lead to anxiety reactions and correct those thoughts.  Muscle  relaxation. This involves tensing muscles then relaxing them.  Choose a stress reduction technique that fits your lifestyle and personality. Stress reduction techniques take time and practice. Set aside 5-15 minutes a day to do them. Therapists can offer training in these techniques. The training may be covered by some insurance plans. Other things you can do to manage stress include:  Keeping a stress diary. This can help you learn what triggers your stress and ways to control your response.  Thinking about how you respond to certain situations. You may not be able to control everything, but you can control your reaction.  Making time for activities that help you relax, and not feeling guilty about spending your time in this way.  Therapy combined with coping and stress-reduction skills provides the best chance for successful treatment. Medicines Medicines can help ease symptoms. Medicines for anxiety include:  Anti-anxiety drugs.  Antidepressants.  Beta-blockers.  Medicines may be used as the main treatment for anxiety disorder, along with therapy, or if other treatments are not working. Medicines should be prescribed by a health care provider. Relationships Relationships can play a big part in helping you recover. Try to spend more time connecting with trusted friends and family members. Consider going to couples counseling, taking family education classes, or going to family therapy. Therapy can help you and others better understand the condition. How to recognize changes in your condition Everyone has a different response to treatment for anxiety. Recovery from anxiety happens when  symptoms decrease and stop interfering with your daily activities at home or work. This may mean that you will start to:  Have better concentration and focus.  Sleep better.  Be less irritable.  Have more energy.  Have improved memory.  It is important to recognize when your condition is getting  worse. Contact your health care provider if your symptoms interfere with home or work and you do not feel like your condition is improving. Where to find help and support: You can get help and support from these sources:  Self-help groups.  Online and [employer] organizations.  A trusted spiritual leader.  Couples counseling.  Family education classes.  Family therapy.  Follow these instructions at home:  Eat a healthy diet that includes plenty of vegetables, fruits, whole grains, low-fat dairy products, and lean protein. Do not eat a lot of foods that are high in solid fats, added sugars, or salt.  Exercise. Most adults should do the following: ? Exercise for at least 150 minutes each week. The exercise should increase your heart rate and make you sweat (moderate-intensity exercise). ? Strengthening exercises at least twice a week.  Cut down on caffeine, tobacco, alcohol, and other potentially harmful substances.  Get the right amount and quality of sleep. Most adults need 7-9 hours of sleep each night.  Make choices that simplify your life.  Take over-the-counter and prescription medicines only as told by your health care provider.  Avoid caffeine, alcohol, and certain over-the-counter cold medicines. These may make you feel worse. Ask your pharmacist which medicines to avoid.  Keep all follow-up visits as told by your health care provider. This is important. Questions to ask your health care provider  Would I benefit from therapy?  How often should I follow up with a health care provider?  How long do I need to take medicine?  Are there any long-term side effects of my medicine?  Are there any alternatives to taking medicine? Contact a health care provider if:  You have a hard time staying focused or finishing daily tasks.  You spend many hours a day feeling worried about everyday life.  You become exhausted by worry.  You start to have headaches, feel tense,  or have nausea.  You urinate more than normal.  You have diarrhea. Get help right away if:  You have a racing heart and shortness of breath.  You have thoughts of hurting yourself or others. If you ever feel like you may hurt yourself or others, or have thoughts about taking your own life, get help right away. You can go to your nearest emergency department or call:  Your local emergency services (911 in the [place]  A suicide crisis helpline, such as the National Suicide Prevention Lifeline at 985-068-27571-870-648-0804. This is open 24-hours a day.  Summary  Taking steps to deal with stress can help calm you.  Medicines cannot cure anxiety disorders, but they can help ease symptoms.  Family, friends, and partners can play a big part in helping you recover from an anxiety disorder. This information is not intended to replace advice given to you by your health care provider. Make sure you discuss any questions you have with your health care provider. Document Released: 02/14/2016 Document Revised: 02/14/2016 Document Reviewed: 02/14/2016 Elsevier Interactive Patient Education  [employer]2018 Elsevier Inc.

## 2017-01-22 NOTE — Progress Notes (Signed)
Date of Visit: 01/22/2017   HPI: Ms. Althea GrimmerRosado is a 26 year old female with PMH of subclinical thyroiditis and pre-diabetes who is here for anxiety. She says that over the past three years she has struggled with concentration due to preoccupation with worry. She says that she has recently had stressors in her personal and professional life causing her to have "panic attacks". She describes these times as up to 5 hours of worry, preoccupation, sweating, and feeling uneasy. She denies palpitations, nausea, and vomiting. She says that she currently tries breathing techniques to calm herself but lately this has not been effective. She says that she has been tried on Hydroxyzine for anxiety in the past and this was not helpful. Patient denies any triggers or PTSD like symptoms.        ROS: See HPI.  PMFSH: Patient is currently dealing with stressors at home including her father's recent stroke and her mother's bipolar episodes. 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   PHYSICAL EXAM: BP 120/70 (BP Location: Left Arm, Patient Position: Sitting, Cuff Size: Large)   Pulse 71   Temp 97.9 F (36.6 C) (Oral)   Ht 5\' 6"  (1.676 m)   Wt 253 lb (114.8 kg)   SpO2 98%   BMI 40.84 kg/m  Gen: Well appearing female, NAD HEENT: Normocephalic, pupils equal and reactive. Heart: RRR, no murmurs, rubs, or gallops Lungs: CTAB, no wheezes, crackles, or rhonchi Neuro: No focal deficits. Ext: No peripheral edema or rashes.           ASSESSMENT/PLAN: Ms. Althea GrimmerRosado is a 26 year old female here for generalized anxiety disorder.   # GAD:  Given the description of symptoms lasting prolonged period of time, top differential is generalized anxiety disorder vs panic disorder.  - Patient agreeable to working with behavioral health to work on coping strategies. - Begin Escitalopram 10 mg - Follow up GAD 7: pretreatment score 8  # Prediabetes: Patient reports that she  has not been taking her metformin lately as she has felt preoccupied. Hgb A1C at goal at 5.4 today. - Continue with Metformin 500 mg BID  # Subclinical Thyroditis: Patient has not been taking synthroid also due to preoccupation with anxiety.  - Follow up today's TSH level - Continue Synthroid 25 mcg   FOLLOW UP: Follow up in two weeks      Jerrilyn CairoKelly Kevona Lupinacci [facility]

## 2017-01-22 NOTE — Progress Notes (Signed)
  Type of Service: Integrated Behavioral Health warm handoff  Estimate time:20 minutes : Interpreter:No.  SUBJECTIVE: Ashlee Herman is a 26 y.o. female referred by Dr. Cherie Darkicco for: symptoms of  anxiety and stressors related to: family issues.  Patient reports the following concerns: anxiety and sleep disturbance, heart racing and difficulty concentrating. Duration of problem: started around 2013; [facility].  Patient did not think it was helpful.  [facility] Impact: difficult to concentrate  LIFE CONTEXT:  Family & Social:patient lives with her mother and father she is the caretaker for both, has two siblings that live out of state and has friends local School / Work /Fun: works at [employer] for past 4 years, enjoys spending time with friends  Life changes: family stressors with medical concerns for both parents  GOALS: Patient will reduce symptoms of: anxiety, and increase  ability MV:HQIONGof:coping skills, self-management skills and stress reduction. INTERVENTION:  Behavioral Therapy (Relaxed breathing), Psychoeducation ; Sleep Hygiene  GAD-7=8,indication of : moderate anxiety.  ISSUES DISCUSSED: Integrated care services, support system, previous and current coping skills, community resources , things patient enjoy or use to enjoy doing, demonstration of relaxed breathing and reviewed work sheet on sleep hygiene.    ASSESSMENT:Patient currently experiencing symptoms of anxiety.  Symptoms exacerbated by life and family stressors. Patient may benefit from, further assessment and brief therapeutic interventions to assist with managing her symptoms, however she is not able to commit to follow up at this time.  Patient apprecitive of information provided but also declined F/U phone call from LCSW.  PLAN:   1.[facility]  2. Behavioral recommendations: relaxed breathing and sleep hygiene  3. Referral:none at this time,   Warm Hand  Off Completed.     Sammuel Hineseborah Kal Chait, LCSW Licensed Clinical Social Worker [facility]   [phone] 3:53 PM

## 2017-01-23 LAB — T4, FREE: Free T4: 1.41 ng/dL (ref 0.82–1.77)

## 2017-01-23 LAB — TSH: TSH: 1.25 u[IU]/mL (ref 0.450–4.500)

## 2017-01-28 ENCOUNTER — Encounter: Payer: Self-pay | Admitting: Family Medicine

## 2017-02-06 ENCOUNTER — Ambulatory Visit: Payer: BLUE CROSS/BLUE SHIELD | Admitting: Family Medicine

## 2017-02-06 ENCOUNTER — Encounter: Payer: Self-pay | Admitting: Family Medicine

## 2017-02-06 VITALS — BP 110/60 | HR 68 | Temp 98.3°F | Ht 66.0 in | Wt 255.0 lb

## 2017-02-06 DIAGNOSIS — F411 Generalized anxiety disorder: Secondary | ICD-10-CM | POA: Diagnosis not present

## 2017-02-06 NOTE — Patient Instructions (Signed)
It was great seeing you today!  Please take a look at which therapists your insurance covers. If you need a referral I'm happy to place one to the provider of your choice. If you need help finding a therapist we are more than happy to help, just call.  Please return to be seen in 1-2 months to see how you are doing.  If you have questions or concerns please do not hesitate to call at [phone].  Ashlee PattyAngela Nira Visscher, DO PGY-2, [facility] 02/06/2017 3:02 PM    Living With Anxiety After being diagnosed with an anxiety disorder, you may be relieved to know why you have felt or behaved a certain way. It is natural to also feel overwhelmed about the treatment ahead and what it will mean for your life. With care and support, you can manage this condition and recover from it. How to cope with anxiety Dealing with stress Stress is your body's reaction to life changes and events, both good and bad. Stress can last just a few hours or it can be ongoing. Stress can play a major role in anxiety, so it is important to learn both how to cope with stress and how to think about it differently. Talk with your health care provider or a counselor to learn more about stress reduction. He or she may suggest some stress reduction techniques, such as:  Music therapy. This can include creating or listening to music that you enjoy and that inspires you.  Mindfulness-based meditation. This involves being aware of your normal breaths, rather than trying to control your breathing. It can be done while sitting or walking.  Centering prayer. This is a kind of meditation that involves focusing on a word, phrase, or sacred image that is meaningful to you and that brings you peace.  Deep breathing. To do this, expand your stomach and inhale slowly through your nose. Hold your breath for 3-5 seconds. Then exhale slowly, allowing your stomach muscles to relax.  Self-talk. This is a skill where you identify  thought patterns that lead to anxiety reactions and correct those thoughts.  Muscle relaxation. This involves tensing muscles then relaxing them.  Choose a stress reduction technique that fits your lifestyle and personality. Stress reduction techniques take time and practice. Set aside 5-15 minutes a day to do them. Therapists can offer training in these techniques. The training may be covered by some insurance plans. Other things you can do to manage stress include:  Keeping a stress diary. This can help you learn what triggers your stress and ways to control your response.  Thinking about how you respond to certain situations. You may not be able to control everything, but you can control your reaction.  Making time for activities that help you relax, and not feeling guilty about spending your time in this way.  Therapy combined with coping and stress-reduction skills provides the best chance for successful treatment. Medicines Medicines can help ease symptoms. Medicines for anxiety include:  Anti-anxiety drugs.  Antidepressants.  Beta-blockers.  Medicines may be used as the main treatment for anxiety disorder, along with therapy, or if other treatments are not working. Medicines should be prescribed by a health care provider. Relationships Relationships can play a big part in helping you recover. Try to spend more time connecting with trusted friends and family members. Consider going to couples counseling, taking family education classes, or going to family therapy. Therapy can help you and others better understand the condition. How  to recognize changes in your condition Everyone has a different response to treatment for anxiety. Recovery from anxiety happens when symptoms decrease and stop interfering with your daily activities at home or work. This may mean that you will start to:  Have better concentration and focus.  Sleep better.  Be less irritable.  Have more  energy.  Have improved memory.  It is important to recognize when your condition is getting worse. Contact your health care provider if your symptoms interfere with home or work and you do not feel like your condition is improving. Where to find help and support: You can get help and support from these sources:  Self-help groups.  Online and [employer] organizations.  A trusted spiritual leader.  Couples counseling.  Family education classes.  Family therapy.  Follow these instructions at home:  Eat a healthy diet that includes plenty of vegetables, fruits, whole grains, low-fat dairy products, and lean protein. Do not eat a lot of foods that are high in solid fats, added sugars, or salt.  Exercise. Most adults should do the following: ? Exercise for at least 150 minutes each week. The exercise should increase your heart rate and make you sweat (moderate-intensity exercise). ? Strengthening exercises at least twice a week.  Cut down on caffeine, tobacco, alcohol, and other potentially harmful substances.  Get the right amount and quality of sleep. Most adults need 7-9 hours of sleep each night.  Make choices that simplify your life.  Take over-the-counter and prescription medicines only as told by your health care provider.  Avoid caffeine, alcohol, and certain over-the-counter cold medicines. These may make you feel worse. Ask your pharmacist which medicines to avoid.  Keep all follow-up visits as told by your health care provider. This is important. Questions to ask your health care provider  Would I benefit from therapy?  How often should I follow up with a health care provider?  How long do I need to take medicine?  Are there any long-term side effects of my medicine?  Are there any alternatives to taking medicine? Contact a health care provider if:  You have a hard time staying focused or finishing daily tasks.  You spend many hours a day feeling worried  about everyday life.  You become exhausted by worry.  You start to have headaches, feel tense, or have nausea.  You urinate more than normal.  You have diarrhea. Get help right away if:  You have a racing heart and shortness of breath.  You have thoughts of hurting yourself or others. If you ever feel like you may hurt yourself or others, or have thoughts about taking your own life, get help right away. You can go to your nearest emergency department or call:  Your local emergency services (911 in the [place]  A suicide crisis helpline, such as the National Suicide Prevention Lifeline at 972-794-54341-7140652190. This is open 24-hours a day.  Summary  Taking steps to deal with stress can help calm you.  Medicines cannot cure anxiety disorders, but they can help ease symptoms.  Family, friends, and partners can play a big part in helping you recover from an anxiety disorder. This information is not intended to replace advice given to you by your health care provider. Make sure you discuss any questions you have with your health care provider. Document Released: 02/14/2016 Document Revised: 02/14/2016 Document Reviewed: 02/14/2016 Elsevier Interactive Patient Education  [employer]2018 Elsevier Inc.

## 2017-02-06 NOTE — Progress Notes (Signed)
Subjective:    Patient ID: Ashlee Herman, female    DOB: 24-Oct-1990, 24 y.o.   MRN: [id number]   CC: follow up anxiety  Has started taking lexapro for past 2 weeks. Takes this with lunch every day. Reports some nausea after taking it. No vomiting. Has not noticed any change in mood or anxiety since initiating treatment. Has not noticed any other negative side effects. Denies SI since starting medication. Anxiety largely related to health of her parents. Reports mom is still unwell but father has improved some after stroke. She is interested in going to therapy. Her cousin goes to a therapist and has said it's very beneficial. Patient would like to look into this and which therapists her insurance covers before getting a referral.   Smoking status reviewed- non-smoker  Review of Systems- see HPI, additionally, no change in appetite, vomiting, diarrhea, constipation   Objective:  BP 110/60   Pulse 68   Temp 98.3 F (36.8 C) (Oral)   Ht 5\' 6"  (1.676 m)   Wt 255 lb (115.7 kg)   LMP 02/04/2017   SpO2 98%   BMI 41.16 kg/m  Vitals and nursing note reviewed  General: well nourished, in no acute distress Cardiac: RRR, clear S1 and S2, no murmurs, rubs, or gallops Respiratory: clear to auscultation bilaterally, no increased work of breathing Extremities: no edema or cyanosis. Neuro: alert and oriented, no focal deficits Psych: mood is content, normal affect   Assessment & Plan:    Generalized anxiety disorder  Tolerating lexapro 10 mg daily with mild nausea. Advised moving this dose to evening time after dinner. No noticeable improvements, per patient. Will check GAD7 at next visit in 1-2 months. Asked patient to call sooner if she has any acute needs.    Return in about 6 weeks (around 03/20/2017), or anxiety.   Dolores Patty, DO Family Medicine Resident PGY-2

## 2017-02-06 NOTE — Assessment & Plan Note (Addendum)
  Tolerating lexapro 10 mg daily with mild nausea. Advised moving this dose to evening time after dinner. No noticeable improvements, per patient. Will check GAD7 at next visit in 1-2 months. Asked patient to call sooner if she has any acute needs.

## 2017-03-14 DIAGNOSIS — R102 Pelvic and perineal pain: Secondary | ICD-10-CM | POA: Diagnosis not present

## 2017-03-14 DIAGNOSIS — Z01419 Encounter for gynecological examination (general) (routine) without abnormal findings: Secondary | ICD-10-CM | POA: Diagnosis not present

## 2017-03-14 DIAGNOSIS — Z3040 Encounter for surveillance of contraceptives, unspecified: Secondary | ICD-10-CM | POA: Diagnosis not present

## 2017-03-14 DIAGNOSIS — Z113 Encounter for screening for infections with a predominantly sexual mode of transmission: Secondary | ICD-10-CM | POA: Diagnosis not present

## 2017-04-02 DIAGNOSIS — N920 Excessive and frequent menstruation with regular cycle: Secondary | ICD-10-CM | POA: Diagnosis not present

## 2017-04-02 DIAGNOSIS — N946 Dysmenorrhea, unspecified: Secondary | ICD-10-CM | POA: Diagnosis not present

## 2017-04-02 DIAGNOSIS — N926 Irregular menstruation, unspecified: Secondary | ICD-10-CM | POA: Diagnosis not present

## 2017-04-02 DIAGNOSIS — R102 Pelvic and perineal pain: Secondary | ICD-10-CM | POA: Diagnosis not present

## 2017-04-09 ENCOUNTER — Other Ambulatory Visit: Payer: Self-pay | Admitting: Obstetrics and Gynecology

## 2017-04-09 DIAGNOSIS — R102 Pelvic and perineal pain: Secondary | ICD-10-CM

## 2017-04-23 ENCOUNTER — Other Ambulatory Visit: Payer: BLUE CROSS/BLUE SHIELD

## 2017-04-26 ENCOUNTER — Ambulatory Visit
Admission: RE | Admit: 2017-04-26 | Discharge: 2017-04-26 | Disposition: A | Discharge status: Home / Self Care | Payer: BLUE CROSS/BLUE SHIELD | Source: Ambulatory Visit | Attending: Obstetrics and Gynecology | Admitting: Obstetrics and Gynecology

## 2017-04-26 DIAGNOSIS — R102 Pelvic and perineal pain: Secondary | ICD-10-CM

## 2017-04-26 MED ORDER — IOPAMIDOL (ISOVUE-300) INJECTION 61%
125.0000 mL | Freq: Once | INTRAVENOUS | Status: AC | PRN
  Administered 2017-04-26: 125 mL via INTRAVENOUS

## 2017-04-30 DIAGNOSIS — N926 Irregular menstruation, unspecified: Secondary | ICD-10-CM | POA: Diagnosis not present

## 2017-04-30 DIAGNOSIS — N920 Excessive and frequent menstruation with regular cycle: Secondary | ICD-10-CM | POA: Diagnosis not present

## 2017-04-30 DIAGNOSIS — N946 Dysmenorrhea, unspecified: Secondary | ICD-10-CM | POA: Diagnosis not present

## 2017-04-30 DIAGNOSIS — R102 Pelvic and perineal pain: Secondary | ICD-10-CM | POA: Diagnosis not present

## 2017-05-09 ENCOUNTER — Telehealth: Payer: Self-pay | Admitting: Family Medicine

## 2017-05-09 NOTE — Telephone Encounter (Signed)
[facility]:  Per Dr Pennie RushingHaygood, the CT scan showed thicking of duoden wall. Dr Pennie RushingHaygood wanted pt to see dr here ASAP but pt hasnt scheduled an appt.  Please advise

## 2017-05-10 NOTE — Telephone Encounter (Signed)
VM left for pt to call the office to schedule an apt with her pcp to followup up with CT.

## 2017-05-22 DIAGNOSIS — J069 Acute upper respiratory infection, unspecified: Secondary | ICD-10-CM | POA: Diagnosis not present

## 2017-06-10 DIAGNOSIS — Z3040 Encounter for surveillance of contraceptives, unspecified: Secondary | ICD-10-CM | POA: Diagnosis not present

## 2017-06-10 DIAGNOSIS — N803 Endometriosis of pelvic peritoneum: Secondary | ICD-10-CM | POA: Diagnosis not present

## 2017-06-10 DIAGNOSIS — R102 Pelvic and perineal pain: Secondary | ICD-10-CM | POA: Diagnosis not present

## 2017-06-10 DIAGNOSIS — K2980 Duodenitis without bleeding: Secondary | ICD-10-CM | POA: Diagnosis not present

## 2017-06-17 DIAGNOSIS — J018 Other acute sinusitis: Secondary | ICD-10-CM | POA: Diagnosis not present

## 2017-06-26 ENCOUNTER — Other Ambulatory Visit: Payer: Self-pay | Admitting: Family Medicine

## 2017-06-27 NOTE — Telephone Encounter (Signed)
LM for patient to call back and schedule a follow up appt.  Ashlee Herman,CMA  

## 2017-06-27 NOTE — Telephone Encounter (Signed)
Please schedule Ashlee Herman for appointment to follow up on mood and check thyroid levels. Thanks!

## 2017-07-17 ENCOUNTER — Ambulatory Visit: Payer: BLUE CROSS/BLUE SHIELD | Admitting: Family Medicine

## 2017-07-17 ENCOUNTER — Encounter: Payer: Self-pay | Admitting: Family Medicine

## 2017-07-17 ENCOUNTER — Other Ambulatory Visit: Payer: Self-pay

## 2017-07-17 VITALS — BP 114/78 | Temp 98.9°F | Ht 66.0 in | Wt 261.8 lb

## 2017-07-17 DIAGNOSIS — N926 Irregular menstruation, unspecified: Secondary | ICD-10-CM | POA: Diagnosis not present

## 2017-07-17 DIAGNOSIS — K219 Gastro-esophageal reflux disease without esophagitis: Secondary | ICD-10-CM

## 2017-07-17 LAB — POCT URINE PREGNANCY: Preg Test, Ur: NEGATIVE

## 2017-07-17 MED ORDER — OMEPRAZOLE 20 MG PO CPDR: 20.0000 mg | DELAYED_RELEASE_CAPSULE | Freq: Every day | ORAL | 1 refills | Status: DC

## 2017-07-17 NOTE — Patient Instructions (Signed)
   It was great seeing you today! I do not think the CT findings in your intestines are anything to be concerned about given you are feeling well without any symptoms.   Please let me know if you develop any concerning symptoms like persistent nausea, vomiting, weight loss, appetite changes, changes in bowel habits.   We will try a 2 week course of omeprazole for reflux.  If you have questions or concerns please do not hesitate to call at [phone].  Dolores Patty, DO PGY-2, [facility] 07/17/2017 3:54 PM

## 2017-07-17 NOTE — Progress Notes (Signed)
    Subjective:    Patient ID: Ashlee Herman, female    DOB: [birth year], 27 y.o.   MRN: [id number]   CC: follow up CT results  Patient has been seeing gyn for right sided pelvic pain which was suspected to be ovarian cyst. She had a pelvic US and then CT abdomen/pelvis to further characterize pain. Pain ultimately being treated as endometriosis by gyn. She is on orlissa, causes nausea, but otherwise this has helped her pelvic pain greatly. On CT, from February, it was noted patient had some wall thickening of her duodenum and proximal jejunum consistent with enteritis. She was told to follow up with PCP about these CT findings.  Patient denies GI complaints overall. Had diarrhea for about a week in the beginning of this month. Was going 4-5 times a day with watery loose stools. She ate some chicken wings that may have been uncooked. This has since resolved and she has been having normal bowel movements. She reports no blood in stools, no rashes anywhere, no abdominal pain, no nausea, no vomiting. Appetite has been normal and robust. No unintentional weight loss.   Does endorse symptoms of GERD, she will have reflux after eating certain foods. She takes tums occasionally with relief.   Smoking status reviewed- non-smoker  Review of Systems- see HPI   Objective:  BP 114/78   Temp 98.9 F (37.2 C) (Oral)   Ht  (1.676 m)   Wt 261 lb 12.8 oz (118.8 kg)   LMP 04/30/2017 (Exact Date) Comment: believes endometriosis meds has stopped cycle  BMI 42.26 kg/m  Vitals and nursing note reviewed  General: well nourished, in no acute distress Cardiac: RRR, clear S1 and S2, no murmurs, rubs, or gallops Respiratory: clear to auscultation bilaterally, no increased work of breathing Abdomen: obese abdomen. Soft. Non tender nondistended, +BS Neuro: alert and oriented, no focal deficits   Assessment & Plan:    1. Gastroesophageal reflux disease, esophagitis presence not specified Patient  reports intermittent symptoms of reflux. CT findings of wall thickening in duodenum may be caused by chronic irritation. Reassurance provided about CT results, patient is asymptomatic. Will tx w/ 2 week course of PPI. Follow up as needed or if she has new GI symptoms.   2. Irregular periods Patient has not had period since beginning orilissa, she is sexually active. Upreg negative.  - POCT urine pregnancy   Return if symptoms worsen or fail to improve.   Dolores Patty, DO Family Medicine Resident PGY-2

## 2017-07-24 DIAGNOSIS — N941 Unspecified dyspareunia: Secondary | ICD-10-CM | POA: Insufficient documentation

## 2017-07-24 DIAGNOSIS — R102 Pelvic and perineal pain: Secondary | ICD-10-CM | POA: Diagnosis not present

## 2017-07-24 DIAGNOSIS — N803 Endometriosis of pelvic peritoneum: Secondary | ICD-10-CM | POA: Diagnosis not present

## 2017-08-14 DIAGNOSIS — Z113 Encounter for screening for infections with a predominantly sexual mode of transmission: Secondary | ICD-10-CM | POA: Diagnosis not present

## 2017-08-14 DIAGNOSIS — R399 Unspecified symptoms and signs involving the genitourinary system: Secondary | ICD-10-CM | POA: Diagnosis not present

## 2017-08-14 DIAGNOSIS — N803 Endometriosis of pelvic peritoneum: Secondary | ICD-10-CM | POA: Diagnosis not present

## 2017-08-14 DIAGNOSIS — N912 Amenorrhea, unspecified: Secondary | ICD-10-CM | POA: Diagnosis not present

## 2017-08-14 DIAGNOSIS — N83201 Unspecified ovarian cyst, right side: Secondary | ICD-10-CM | POA: Diagnosis not present

## 2017-08-14 DIAGNOSIS — B009 Herpesviral infection, unspecified: Secondary | ICD-10-CM | POA: Insufficient documentation

## 2017-08-15 ENCOUNTER — Other Ambulatory Visit: Payer: Self-pay | Admitting: Family Medicine

## 2017-08-22 DIAGNOSIS — R768 Other specified abnormal immunological findings in serum: Secondary | ICD-10-CM | POA: Diagnosis not present

## 2017-10-02 NOTE — Progress Notes (Signed)
Office Visit Note  Patient: Ashlee Herman             Date of Birth: 11/05/1990           MRN: [id number]             PCP: Tillman Sersiccio, Angela C, DO Referring: Hal MoralesHaygood, Vanessa P, MD Visit Date: 10/16/2017 Occupation: Pharmacy Technician  Subjective:  Positive ANA   History of Present Illness: Ashlee Gowdaeresa E Knack is a 27 y.o. female seen in consultation per request of Dr. Pennie RushingHaygood.  According to patient she has had long-standing history of endometriosis.  She was experiencing some abdominal pain and went to see her GYN.  Where she had labs done and was positive for RPR.  She was called in for further testing and her ANA was positive.  She was referred here for further evaluation.  Patient states that she has history of fatigue and joint pain in feet.  She denies any joint swelling.  She states she had injury to her left ankle joint which required cast.  She also had right ankle sprain requiring a brace.  She also experiences some generalized body ache.  She has a rash on top of her right eyelid.  She denies any history of Raynaud's phenomenon.  There is no family history of autoimmune disease.  Activities of Daily Living:  Patient reports morning stiffness for 10 minutes.   Patient Reports nocturnal pain.  Difficulty dressing/grooming: Denies Difficulty climbing stairs: Denies Difficulty getting out of chair: Denies Difficulty using hands for taps, buttons, cutlery, and/or writing: Denies  Review of Systems  Constitutional: Positive for fatigue. Negative for night sweats, weight gain and weight loss.  HENT: Negative for mouth sores, trouble swallowing, trouble swallowing, mouth dryness and nose dryness.   Eyes: Negative for pain, redness, visual disturbance and dryness.  Respiratory: Negative for cough, shortness of breath and difficulty breathing.   Cardiovascular: Negative for chest pain, palpitations, hypertension, irregular heartbeat and swelling in legs/feet.  Gastrointestinal: Negative  for blood in stool, constipation and diarrhea.  Endocrine: Negative for increased urination.  Genitourinary: Negative for vaginal dryness.  Musculoskeletal: Positive for arthralgias, joint pain and morning stiffness. Negative for joint swelling, myalgias, muscle weakness, muscle tenderness and myalgias.  Skin: Positive for rash. Negative for color change, hair loss, skin tightness, ulcers and sensitivity to sunlight.       Over the right eyelid  Allergic/Immunologic: Negative for susceptible to infections.  Neurological: Negative for dizziness, memory loss, night sweats and weakness.  Hematological: Negative for swollen glands.  Psychiatric/Behavioral: Positive for sleep disturbance. Negative for depressed mood. The patient is nervous/anxious.     PMFS History:  Patient Active Problem List   Diagnosis Date Noted  . Generalized anxiety disorder 02/06/2017  . Intractable migraine with aura without status migrainosus 05/24/2016  . Mild persistent asthma without complication 03/19/2016  . Prediabetes   . Obesity   . Goiter   . Hypertension   . Thyroiditis, autoimmune   . Dyspepsia   . Hypothyroid   . Fainting episodes   . Pre-diabetes 06/12/2010  . Other specified acquired hypothyroidism 06/12/2010    Past Medical History:  Diagnosis Date  . Asthma 11/2009  . Diabetes mellitus   . Dyspepsia   . Fainting episodes   . Goiter   . H/O dysmenorrhea 11/2009  . H/O: menorrhagia 11/2009  . Hypertension   . Hypothyroid   . Obesity   . Thyroid activity decreased   . Thyroiditis, autoimmune  Family History  Problem Relation Age of Onset  . Obesity Mother   . Hypertension Mother   . Obesity Father   . Cancer Maternal Grandmother   . Thyroid disease Neg Hx    Past Surgical History:  Procedure Laterality Date  . CLEFT PALATE REPAIR    . TYMPANOSTOMY TUBE PLACEMENT     Social History   Social History Narrative  . Not on file    Objective: Vital Signs: BP 120/73 (BP  Location: Right Arm, Patient Position: Sitting, Cuff Size: Large)   Pulse 78   Resp 16   Ht 5' 5.5" (1.664 m)   Wt 269 lb (122 kg)   LMP 04/18/2017 (Approximate)   BMI 44.08 kg/m    Physical Exam  Constitutional: She is oriented to person, place, and time. She appears well-developed and well-nourished.  HENT:  Head: Normocephalic and atraumatic.  Eyes: Conjunctivae and EOM are normal.  Neck: Normal range of motion.  Cardiovascular: Normal rate, regular rhythm, normal heart sounds and intact distal pulses.  Pulmonary/Chest: Effort normal and breath sounds normal.  Abdominal: Soft. Bowel sounds are normal.  Lymphadenopathy:    She has no cervical adenopathy.  Neurological: She is alert and oriented to person, place, and time.  Skin: Skin is warm and dry. Capillary refill takes less than 2 seconds. Rash noted.  Erythematous rash noted over her right eyelid and over glabella.  Psychiatric: She has a normal mood and affect. Her behavior is normal.  Nursing note and vitals reviewed.    Musculoskeletal Exam: Spine thoracic lumbar spine good range of motion.  Shoulder joints elbow joints wrist joint MCPs PIPs DIPs were in good range of motion with no synovitis.  Hip joints knee joints ankles MTPs PIPs DIPs were in good range of motion with no synovitis.  She has mild tenderness over left Achilles tendon.  CDAI Exam: No CDAI exam completed.   Investigation: Findings:  08/26/17: ANA 1:80 homogenous, dsDNA 20, ss-A -, ss-B -, smith -, RNP -, chromatin -, C3 196, C4 31, CH50 >60, RPR reactive, HSV 2 normal, HSV 1 high, Hep C -, HIV -   Imaging: No results found.  Recent Labs: Lab Results  Component Value Date   WBC 9.1 07/14/2014   HGB 12.2 07/14/2014   PLT 323 07/14/2014   NA 138 07/14/2014   K 4.2 07/14/2014   CL 102 07/14/2014   CO2 25 07/14/2014   GLUCOSE 111 (H) 07/14/2014   BUN 9 07/14/2014   CREATININE 0.89 07/14/2014   BILITOT 0.5 07/14/2014   ALKPHOS 42 07/14/2014    AST 19 07/14/2014   ALT 15 07/14/2014   PROT 8.2 (H) 07/14/2014   ALBUMIN 3.9 07/14/2014   CALCIUM 9.6 07/14/2014   GFRAA >60 07/14/2014    Speciality Comments: No specialty comments available.  Procedures:  No procedures performed Allergies: Tramadol   Assessment / Plan:     Visit Diagnoses: ANA positive -patient had some lab work at her GYNs office and ANA was positive.  She gives history of fatigue, generalized myalgias, pain in her feet and a rash over her right eyelid.  I will obtain AVISE to evaluate this further. ( 08/26/17: ANA 1:80 homogenous, dsDNA 20, ss-A -, ss-B -, smith -, RNP -, chromatin -, C3 196, C4 31, CH50 >60, RPR reactive, HSV 2 normal, HSV 1 high, Hep C -) - Plan: Urinalysis, Routine w reflex microscopic, Lupus Anticoagulant Eval w/Reflex  Other fatigue -she gives history of chronic fatigue.  Plan: CBC with Differential/Platelet, COMPLETE METABOLIC PANEL WITH GFR, CK, TSH, Sedimentation rate, Hepatitis B core antibody, IgM, Hepatitis B surface antigen, Glucose 6 phosphate dehydrogenase  Pain in both feet -patient had injury to her bilateral ankles in the past.  She gives history of chronic discomfort in her feet and ankles.  No synovitis was noted.  Plan: Rheumatoid factor, Cyclic citrul peptide antibody, IgG  Rash-on top of her right eyelid and glabella.  If her labs are positive I may consider getting a skin biopsy.  History of anemia  History of type 2 diabetes mellitus  Hx of migraine headaches  History of asthma  History of endometriosis  Herpes simplex type 1 infection  Acquired hypothyroidism   Orders: Orders Placed This Encounter  Procedures  . CBC with Differential/Platelet  . COMPLETE METABOLIC PANEL WITH GFR  . Urinalysis, Routine w reflex microscopic  . CK  . TSH  . Sedimentation rate  . Lupus Anticoagulant Eval w/Reflex  . Hepatitis B core antibody, IgM  . Hepatitis B surface antigen  . Glucose 6 phosphate dehydrogenase  .  Rheumatoid factor  . Cyclic citrul peptide antibody, IgG   No orders of the defined types were placed in this encounter.   Face-to-face time spent with patient was 50 minutes. Greater than 50% of time was spent in counseling and coordination of care.  Follow-Up Instructions: Return for Fatigue, positive ANA.   Pollyann Savoy, MD  Note - This record has been created using Animal nutritionist.  Chart creation errors have been sought, but may not always  have been located. Such creation errors do not reflect on  the standard of medical care.

## 2017-10-16 ENCOUNTER — Encounter (INDEPENDENT_AMBULATORY_CARE_PROVIDER_SITE_OTHER): Payer: Self-pay

## 2017-10-16 ENCOUNTER — Ambulatory Visit: Payer: BLUE CROSS/BLUE SHIELD | Admitting: Rheumatology

## 2017-10-16 ENCOUNTER — Encounter: Payer: Self-pay | Admitting: Rheumatology

## 2017-10-16 VITALS — BP 120/73 | HR 78 | Resp 16 | Ht 65.5 in | Wt 269.0 lb

## 2017-10-16 DIAGNOSIS — M79672 Pain in left foot: Secondary | ICD-10-CM | POA: Diagnosis not present

## 2017-10-16 DIAGNOSIS — R768 Other specified abnormal immunological findings in serum: Secondary | ICD-10-CM

## 2017-10-16 DIAGNOSIS — R21 Rash and other nonspecific skin eruption: Secondary | ICD-10-CM

## 2017-10-16 DIAGNOSIS — Z8669 Personal history of other diseases of the nervous system and sense organs: Secondary | ICD-10-CM

## 2017-10-16 DIAGNOSIS — Z8639 Personal history of other endocrine, nutritional and metabolic disease: Secondary | ICD-10-CM

## 2017-10-16 DIAGNOSIS — E039 Hypothyroidism, unspecified: Secondary | ICD-10-CM

## 2017-10-16 DIAGNOSIS — M79671 Pain in right foot: Secondary | ICD-10-CM | POA: Diagnosis not present

## 2017-10-16 DIAGNOSIS — B009 Herpesviral infection, unspecified: Secondary | ICD-10-CM

## 2017-10-16 DIAGNOSIS — R5383 Other fatigue: Secondary | ICD-10-CM | POA: Diagnosis not present

## 2017-10-16 DIAGNOSIS — Z8742 Personal history of other diseases of the female genital tract: Secondary | ICD-10-CM

## 2017-10-16 DIAGNOSIS — Z8709 Personal history of other diseases of the respiratory system: Secondary | ICD-10-CM

## 2017-10-16 DIAGNOSIS — Z862 Personal history of diseases of the blood and blood-forming organs and certain disorders involving the immune mechanism: Secondary | ICD-10-CM

## 2017-10-16 DIAGNOSIS — R7689 Other specified abnormal immunological findings in serum: Secondary | ICD-10-CM

## 2017-10-18 LAB — URINALYSIS, ROUTINE W REFLEX MICROSCOPIC
Bilirubin Urine: NEGATIVE
GLUCOSE, UA: NEGATIVE
Hgb urine dipstick: NEGATIVE
Ketones, ur: NEGATIVE
LEUKOCYTES UA: NEGATIVE
NITRITE: NEGATIVE
PROTEIN: NEGATIVE
Specific Gravity, Urine: 1.019 (ref 1.001–1.03)
pH: 5.5 (ref 5.0–8.0)

## 2017-10-18 LAB — LUPUS ANTICOAGULANT EVAL W/ REFLEX
PTT-LA Screen: 36 s (ref <=40)
dRVVT: 39 s (ref <=45)

## 2017-10-18 LAB — CBC WITH DIFFERENTIAL/PLATELET
BASOS PCT: 0.5 %
Basophils Absolute: 28 cells/uL (ref 0–200)
EOS ABS: 73 {cells}/uL (ref 15–500)
Eosinophils Relative: 1.3 %
HCT: 39.3 % (ref 35.0–45.0)
Hemoglobin: 13.2 g/dL (ref 11.7–15.5)
Lymphs Abs: 1232 cells/uL (ref 850–3900)
MCH: 28.7 pg (ref 27.0–33.0)
MCHC: 33.6 g/dL (ref 32.0–36.0)
MCV: 85.4 fL (ref 80.0–100.0)
MONOS PCT: 6.8 %
MPV: 9.5 fL (ref 7.5–12.5)
NEUTROS PCT: 69.4 %
Neutro Abs: 3886 cells/uL (ref 1500–7800)
PLATELETS: 330 10*3/uL (ref 140–400)
RBC: 4.6 10*6/uL (ref 3.80–5.10)
RDW: 12.7 % (ref 11.0–15.0)
TOTAL LYMPHOCYTE: 22 %
WBC mixed population: 381 cells/uL (ref 200–950)
WBC: 5.6 10*3/uL (ref 3.8–10.8)

## 2017-10-18 LAB — HEPATITIS B SURFACE ANTIGEN: HEP B S AG: NONREACTIVE

## 2017-10-18 LAB — COMPLETE METABOLIC PANEL WITH GFR
AG RATIO: 1.2 (calc) (ref 1.0–2.5)
ALT: 11 U/L (ref 6–29)
AST: 17 U/L (ref 10–30)
Albumin: 4.3 g/dL (ref 3.6–5.1)
Alkaline phosphatase (APISO): 38 U/L (ref 33–115)
BILIRUBIN TOTAL: 0.3 mg/dL (ref 0.2–1.2)
BUN: 11 mg/dL (ref 7–25)
CHLORIDE: 103 mmol/L (ref 98–110)
CO2: 27 mmol/L (ref 20–32)
Calcium: 9.7 mg/dL (ref 8.6–10.2)
Creat: 0.87 mg/dL (ref 0.50–1.10)
GFR, EST AFRICAN AMERICAN: 107 mL/min/{1.73_m2} (ref >=60)
GFR, Est Non African American: 93 mL/min/{1.73_m2} (ref >=60)
GLUCOSE: 98 mg/dL (ref 65–99)
Globulin: 3.6 g/dL (calc) (ref 1.9–3.7)
Potassium: 4.4 mmol/L (ref 3.5–5.3)
Sodium: 138 mmol/L (ref 135–146)
TOTAL PROTEIN: 7.9 g/dL (ref 6.1–8.1)

## 2017-10-18 LAB — CK: Total CK: 193 U/L — ABNORMAL HIGH (ref 29–143)

## 2017-10-18 LAB — RHEUMATOID FACTOR: Rhuematoid fact SerPl-aCnc: <14 IU/mL (ref <14)

## 2017-10-18 LAB — GLUCOSE 6 PHOSPHATE DEHYDROGENASE: G-6PDH: 17.1 U/g{Hb} (ref 7.0–20.5)

## 2017-10-18 LAB — HEPATITIS B CORE ANTIBODY, IGM: Hep B C IgM: NONREACTIVE

## 2017-10-18 LAB — CYCLIC CITRUL PEPTIDE ANTIBODY, IGG: Cyclic Citrullin Peptide Ab: 16 UNITS

## 2017-10-18 LAB — TSH: TSH: 3.26 m[IU]/L

## 2017-10-18 LAB — SEDIMENTATION RATE: Sed Rate: 36 mm/h — ABNORMAL HIGH (ref 0–20)

## 2017-10-18 NOTE — Progress Notes (Signed)
Will discuss at the fu visit

## 2017-10-24 ENCOUNTER — Other Ambulatory Visit: Payer: Self-pay | Admitting: Family Medicine

## 2017-11-08 NOTE — Progress Notes (Signed)
Office Visit Note  Patient: Ashlee Herman             Date of Birth: 1990/10/22           MRN: [id number]             PCP: Steve Rattler, DO Referring: Steve Rattler, DO Visit Date: 11/21/2017 Occupation: pharmacy technician  Subjective:  Pain in multiple joints.   History of Present Illness: Ashlee Herman is a 27 y.o. female with history of positive ANA.  She states the rash on her right eyelid resolved after using topical steroids.  She has had no recurrence of rash.  She states she continues to have some pain and discomfort in her multiple joints.  She has been seeing Dr. Lynann Bologna since 2012 for her joint pain.  She describes pain in her bilateral hands, right hip, lower back and her ankles.  She states her hands swell off and on.  Activities of Daily Living:  Patient reports morning stiffness for 5-10 minutes.   Patient Reports nocturnal pain.  Difficulty dressing/grooming: Denies Difficulty climbing stairs: Denies Difficulty getting out of chair: Denies Difficulty using hands for taps, buttons, cutlery, and/or writing: Denies  Review of Systems  Constitutional: Positive for fatigue. Negative for night sweats, weight gain and weight loss.  HENT: Negative for mouth sores, trouble swallowing, trouble swallowing, mouth dryness and nose dryness.   Eyes: Negative for pain, redness, visual disturbance and dryness.  Respiratory: Negative for cough, shortness of breath, wheezing and difficulty breathing.   Cardiovascular: Negative for chest pain, palpitations, hypertension, irregular heartbeat and swelling in legs/feet.  Gastrointestinal: Negative for abdominal pain, blood in stool, constipation, diarrhea, nausea and vomiting.  Endocrine: Negative for increased urination.  Genitourinary: Negative for nocturia, pelvic pain and vaginal dryness.  Musculoskeletal: Positive for arthralgias, joint pain, joint swelling and morning stiffness. Negative for myalgias, muscle weakness,  muscle tenderness and myalgias.  Skin: Positive for hair loss. Negative for color change, rash, skin tightness, ulcers and sensitivity to sunlight.  Allergic/Immunologic: Negative for susceptible to infections.  Neurological: Positive for weakness. Negative for dizziness, light-headedness, headaches, memory loss and night sweats.  Hematological: Negative for bruising/bleeding tendency and swollen glands.  Psychiatric/Behavioral: Negative for depressed mood, confusion and sleep disturbance. The patient is not nervous/anxious.     PMFS History:  Patient Active Problem List   Diagnosis Date Noted  . Generalized anxiety disorder 02/06/2017  . Intractable migraine with aura without status migrainosus 05/24/2016  . Mild persistent asthma without complication 40/12/2723  . Prediabetes   . Obesity   . Goiter   . Hypertension   . Thyroiditis, autoimmune   . Dyspepsia   . Hypothyroid   . Fainting episodes   . Pre-diabetes 06/12/2010  . Other specified acquired hypothyroidism 06/12/2010    Past Medical History:  Diagnosis Date  . Asthma 11/2009  . Diabetes mellitus   . Dyspepsia   . Fainting episodes   . Goiter   . H/O dysmenorrhea 11/2009  . H/O: menorrhagia 11/2009  . Hypertension   . Hypothyroid   . Obesity   . Thyroid activity decreased   . Thyroiditis, autoimmune     Family History  Problem Relation Age of Onset  . Obesity Mother   . Hypertension Mother   . Obesity Father   . Cancer Maternal Grandmother   . Thyroid disease Neg Hx    Past Surgical History:  Procedure Laterality Date  . CLEFT PALATE REPAIR    .  TYMPANOSTOMY TUBE PLACEMENT     Social History   Social History Narrative  . Not on file    Objective: Vital Signs: BP 124/78 (BP Location: Left Arm, Patient Position: Sitting, Cuff Size: Large)   Pulse 77   Resp 16   Ht '5\' 6"'$  (1.676 m)   Wt 269 lb (122 kg)   BMI 43.42 kg/m    Physical Exam  Constitutional: She is oriented to person, place, and time.  She appears well-developed and well-nourished.  HENT:  Head: Normocephalic and atraumatic.  Eyes: Conjunctivae and EOM are normal.  Neck: Normal range of motion.  Cardiovascular: Normal rate, regular rhythm, normal heart sounds and intact distal pulses.  Pulmonary/Chest: Effort normal and breath sounds normal.  Abdominal: Soft. Bowel sounds are normal.  Lymphadenopathy:    She has no cervical adenopathy.  Neurological: She is alert and oriented to person, place, and time.  Skin: Skin is warm and dry. Capillary refill takes less than 2 seconds.  Psychiatric: She has a normal mood and affect. Her behavior is normal.  Nursing note and vitals reviewed.    Musculoskeletal Exam: C-spine thoracic lumbar spine good range of motion.  Shoulder joints elbow joints wrist joints with good range of motion.  She has tenderness over palpation over bilateral wrist joints and bilateral MCPs.  She had painful range of motion of right hip joint.  Knee joints were in good range of motion.  She has tenderness on palpation of bilateral ankles.  Mild tenderness over MTP joints.  CDAI Exam: CDAI Score: Not documented Patient Global Assessment: Not documented; Provider Global Assessment: Not documented Swollen: Not documented; Tender: Not documented Joint Exam   Not documented   There is currently no information documented on the homunculus. Go to the Rheumatology activity and complete the homunculus joint exam.  Investigation: No additional findings.  Imaging: Xr Hip Unilat W Or W/o Pelvis 2-3 Views Right  Result Date: 11/21/2017 No hip joint or SI joint narrowing was noted. Impression: Unremarkable x-ray of the hip joint.  Xr Foot 2 Views Left  Result Date: 11/21/2017 No MTP, PIP or DIP or intertarsal joint space narrowing was noted.  No erosive changes were noted. Impression: Unremarkable x-ray of the foot.  Xr Foot 2 Views Right  Result Date: 11/21/2017 No MTP, PIP or DIP or intertarsal joint  space narrowing was noted.  No erosive changes were noted. Impression: Unremarkable x-ray of the foot.  Xr Hand 2 View Left  Result Date: 11/21/2017 No MCP, PIP, DIP or intercarpal radiocarpal joint space narrowing was noted.  No erosive changes were noted. Impression: Unremarkable x-ray of the hand.  Xr Hand 2 View Right  Result Date: 11/21/2017 No MCP, PIP, DIP or intercarpal radiocarpal joint space narrowing was noted.  No erosive changes were noted. Impression: Unremarkable x-ray of the hand.   Recent Labs: Lab Results  Component Value Date   WBC 5.6 10/16/2017   HGB 13.2 10/16/2017   PLT 330 10/16/2017   NA 138 10/16/2017   K 4.4 10/16/2017   CL 103 10/16/2017   CO2 27 10/16/2017   GLUCOSE 98 10/16/2017   BUN 11 10/16/2017   CREATININE 0.87 10/16/2017   BILITOT 0.3 10/16/2017   ALKPHOS 42 07/14/2014   AST 17 10/16/2017   ALT 11 10/16/2017   PROT 7.9 10/16/2017   ALBUMIN 3.9 07/14/2014   CALCIUM 9.7 10/16/2017   GFRAA 107 10/16/2017  UA negative, CK 193, ESR 36, TSH normal, lupus anticoagulant negative, RF negative,  anti-CCP negative, hepatitis B-, G6PD normal AVISE index 0.4, ANA 1: 320 speckled, ENA negative, CB CAP negative, anticardiolipin negative, beta-2 negative RF IgA positive, antithyroglobulin positive Speciality Comments: No specialty comments available.  Procedures:  No procedures performed Allergies: Tramadol   Assessment / Plan:     Visit Diagnoses: Positive ANA (antinuclear  antibody) - Repeat ANA 1: 320 speckled and ENA negative.AVISE index 0.4.  Pain in both hands -she has been complaining of arthralgias in her bilateral hands.  She has RF IgA positive.  I will also schedule ultrasound of bilateral hands to evaluate this further.  Plan: XR Hand 2 View Right, XR Hand 2 View Left.  The x-ray of bilateral hands were unremarkable.  Pain in right hip -she has discomfort range of motion of her right hip.  Plan: XR HIP UNILAT W OR W/O PELVIS 2-3 VIEWS RIGHT.   The x-ray of the right hip joint was unremarkable.  Chronic pain of both ankles -she complains of discomfort in her bilateral ankles for many years.  Plan: XR Foot 2 Views Right, XR Foot 2 Views Left.  X-ray of bilateral feet were unremarkable.  Rash - on top of her right eyelid and glabella.  The rash is resolved after using hydrocortisone cream.  Other fatigue-she has history of chronic fatigue.  History of anemia  History of type 2 diabetes mellitus  Hx of migraine headaches  History of asthma  History of endometriosis  Acquired hypothyroidism   Orders: Orders Placed This Encounter  Procedures  . XR Hand 2 View Right  . XR Hand 2 View Left  . XR HIP UNILAT W OR W/O PELVIS 2-3 VIEWS RIGHT  . XR Foot 2 Views Right  . XR Foot 2 Views Left   No orders of the defined types were placed in this encounter.   Face-to-face time spent with patient was 30 minutes. Greater than 50% of time was spent in counseling and coordination of care.  Follow-Up Instructions: Return in about 2 months (around 01/21/2018) for Polyarthralgia, +ANA.   Bo Merino, MD  Note - This record has been created using Editor, commissioning.  Chart creation errors have been sought, but may not always  have been located. Such creation errors do not reflect on  the standard of medical care.

## 2017-11-15 DIAGNOSIS — H6980 Other specified disorders of Eustachian tube, unspecified ear: Secondary | ICD-10-CM | POA: Diagnosis not present

## 2017-11-15 DIAGNOSIS — J0100 Acute maxillary sinusitis, unspecified: Secondary | ICD-10-CM | POA: Diagnosis not present

## 2017-11-21 ENCOUNTER — Encounter: Payer: Self-pay | Admitting: Rheumatology

## 2017-11-21 ENCOUNTER — Ambulatory Visit (INDEPENDENT_AMBULATORY_CARE_PROVIDER_SITE_OTHER): Payer: Self-pay

## 2017-11-21 ENCOUNTER — Ambulatory Visit: Payer: BLUE CROSS/BLUE SHIELD | Admitting: Rheumatology

## 2017-11-21 VITALS — BP 124/78 | HR 77 | Resp 16 | Ht 66.0 in | Wt 269.0 lb

## 2017-11-21 DIAGNOSIS — M79641 Pain in right hand: Secondary | ICD-10-CM | POA: Diagnosis not present

## 2017-11-21 DIAGNOSIS — M25572 Pain in left ankle and joints of left foot: Secondary | ICD-10-CM

## 2017-11-21 DIAGNOSIS — R21 Rash and other nonspecific skin eruption: Secondary | ICD-10-CM

## 2017-11-21 DIAGNOSIS — M79642 Pain in left hand: Secondary | ICD-10-CM | POA: Diagnosis not present

## 2017-11-21 DIAGNOSIS — R7689 Other specified abnormal immunological findings in serum: Secondary | ICD-10-CM

## 2017-11-21 DIAGNOSIS — M25551 Pain in right hip: Secondary | ICD-10-CM | POA: Diagnosis not present

## 2017-11-21 DIAGNOSIS — M25571 Pain in right ankle and joints of right foot: Secondary | ICD-10-CM | POA: Diagnosis not present

## 2017-11-21 DIAGNOSIS — R768 Other specified abnormal immunological findings in serum: Secondary | ICD-10-CM

## 2017-11-21 DIAGNOSIS — R5383 Other fatigue: Secondary | ICD-10-CM

## 2017-11-21 DIAGNOSIS — G8929 Other chronic pain: Secondary | ICD-10-CM

## 2017-12-11 ENCOUNTER — Ambulatory Visit (INDEPENDENT_AMBULATORY_CARE_PROVIDER_SITE_OTHER): Payer: Self-pay

## 2017-12-11 ENCOUNTER — Ambulatory Visit (INDEPENDENT_AMBULATORY_CARE_PROVIDER_SITE_OTHER): Payer: BLUE CROSS/BLUE SHIELD | Admitting: Rheumatology

## 2017-12-11 DIAGNOSIS — M79641 Pain in right hand: Secondary | ICD-10-CM

## 2017-12-11 DIAGNOSIS — M79642 Pain in left hand: Secondary | ICD-10-CM | POA: Diagnosis not present

## 2017-12-20 DIAGNOSIS — N803 Endometriosis of pelvic peritoneum: Secondary | ICD-10-CM | POA: Diagnosis not present

## 2017-12-20 DIAGNOSIS — N941 Unspecified dyspareunia: Secondary | ICD-10-CM | POA: Diagnosis not present

## 2017-12-20 DIAGNOSIS — R102 Pelvic and perineal pain: Secondary | ICD-10-CM | POA: Diagnosis not present

## 2018-01-10 NOTE — Progress Notes (Signed)
Office Visit Note  Patient: Ashlee Herman             Date of Birth: [birth year]           MRN: [id number]             PCP: Tillman Sers, DO Referring: Tillman Sers, DO Visit Date: 01/23/2018 Occupation: @GUAROCC @  Subjective:  Pain in both wrists and ankle joints  History of Present Illness: Ashlee Herman is a 27 y.o. female with history of positive ANA, polyarthralgia, and rash.  She states since her last visit she continues to have pain and stiffness in both wrists.  She has also been having bilateral achilles tendonitis and ankle joint stiffness.  She states the stiffness is most severe for the first 10 minutes when she wakes up in the morning.  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 She takes advil PRN at bedtime since her pain is worse at night.  she reports neck stiffness. She denies any other joint pain or joint swelling at this time.  She continues to have an intermittent rash on right eyelid and she uses hydrocortisone cream which resolves the rash if uses it consistently.  She has not been evaluated by a dermatologist.  She denies any other rashes, history of psoriasis, or sensitivity to sunlight.  She denies any sores in her mouth or nose.  She denies any swollen lymph nodes.  She denies any symptoms of Raynaud's.  She experiences eye dryness but attributes it to contact use.  She denies any mouth dryness.  She denies any hair loss. She denies any other new or worsening symptoms.   Activities of Daily Living:  Patient reports morning stiffness for 10  minutes.   Patient Reports nocturnal pain.  Difficulty dressing/grooming: Denies Difficulty climbing stairs: Denies Difficulty getting out of chair: Denies Difficulty using hands for taps, buttons, cutlery, and/or writing: Denies  Review of Systems  Constitutional: Positive for fatigue.  HENT: Negative for mouth sores, mouth dryness and nose dryness.   Eyes: Positive for dryness  (Contact use). Negative for pain and visual disturbance.  Respiratory: Negative for cough, hemoptysis, shortness of breath and difficulty breathing.   Cardiovascular: Positive for swelling in legs/feet. Negative for chest pain, palpitations and hypertension.  Gastrointestinal: Negative for blood in stool, constipation and diarrhea.  Endocrine: Negative for increased urination.  Genitourinary: Negative for difficulty urinating and painful urination.  Musculoskeletal: Positive for arthralgias, joint pain, joint swelling, myalgias, muscle weakness, morning stiffness and myalgias. Negative for muscle tenderness.  Skin: Positive for rash. Negative for color change, pallor, hair loss, nodules/bumps, skin tightness, ulcers and sensitivity to sunlight.  Allergic/Immunologic: Negative for susceptible to infections.  Neurological: Negative for dizziness and headaches.  Hematological: Negative for swollen glands.  Psychiatric/Behavioral: Positive for sleep disturbance. Negative for depressed mood. The patient is not nervous/anxious.     PMFS History:  Patient Active Problem List   Diagnosis Date Noted  . Generalized anxiety disorder 02/06/2017  . Intractable migraine with aura without status migrainosus 05/24/2016  . Mild persistent asthma without complication 03/19/2016  . Prediabetes   . Obesity   . Goiter   . Hypertension   . Thyroiditis, autoimmune   . Dyspepsia   . Hypothyroid   . Fainting episodes   . Pre-diabetes 06/12/2010  . Other specified acquired hypothyroidism 06/12/2010    Past Medical History:  Diagnosis Date  . Asthma 11/2009  . Diabetes  mellitus   . Dyspepsia   . Fainting episodes   . Goiter   . H/O dysmenorrhea 11/2009  . H/O: menorrhagia 11/2009  . Hypertension   . Hypothyroid   . Obesity   . Thyroid activity decreased   . Thyroiditis, autoimmune     Family History  Problem Relation Age of Onset  . Obesity Mother   . Hypertension Mother   . Obesity Father   .  Cancer Maternal Grandmother   . Thyroid disease Neg Hx    Past Surgical History:  Procedure Laterality Date  . CLEFT PALATE REPAIR    . TYMPANOSTOMY TUBE PLACEMENT     Social History   Social History Narrative  . Not on file    Objective: Vital Signs: BP 120/87 (BP Location: Left Arm, Patient Position: Sitting, Cuff Size: Normal)   Pulse 70   Ht 5\' 6"  (1.676 m)   Wt 271 lb 9.6 oz (123.2 kg)   BMI 43.84 kg/m    Physical Exam  Constitutional: She is oriented to person, place, and time. She appears well-developed and well-nourished.  HENT:  Head: Normocephalic and atraumatic.  Eyes: Conjunctivae and EOM are normal.  Neck: Normal range of motion.  Cardiovascular: Normal rate, regular rhythm, normal heart sounds and intact distal pulses.  Pulmonary/Chest: Effort normal and breath sounds normal.  Abdominal: Soft. Bowel sounds are normal.  Lymphadenopathy:    She has no cervical adenopathy.  Neurological: She is alert and oriented to person, place, and time.  Skin: Skin is warm and dry. Capillary refill takes less than 2 seconds.  Psychiatric: She has a normal mood and affect. Her behavior is normal.  Nursing note and vitals reviewed.    Musculoskeletal Exam: C-spine, thoracic spine, lumbar spine good range of motion.  No midline spinal tenderness.  No SI joint tenderness.  Shoulder joints, elbow joints, wrist joints, MCPs, PIPs, DIPs good range of motion no synovitis.  She has tenderness in the ulnar aspect of bilateral wrist joints but no synovitis noted.  Hip joints, knee joints, ankle joints, MTPs, PIPs, DIPs good range of motion no synovitis.  No warmth or effusion of bilateral knee joints. no tenderness or swelling of ankle joints.  She has tenderness of the Achilles tendons bilaterally.  No plantar fasciitis.  No tenderness of trochanteric bursa bilaterally.  CDAI Exam: CDAI Score: Not documented Patient Global Assessment: Not documented; Provider Global Assessment: Not  documented Swollen: Not documented; Tender: Not documented Joint Exam   Not documented   There is currently no information documented on the homunculus. Go to the Rheumatology activity and complete the homunculus joint exam.  Investigation: No additional findings.  Imaging: No results found.  Recent Labs: Lab Results  Component Value Date   WBC 5.6 10/16/2017   HGB 13.2 10/16/2017   PLT 330 10/16/2017   NA 138 10/16/2017   K 4.4 10/16/2017   CL 103 10/16/2017   CO2 27 10/16/2017   GLUCOSE 98 10/16/2017   BUN 11 10/16/2017   CREATININE 0.87 10/16/2017   BILITOT 0.3 10/16/2017   ALKPHOS 42 07/14/2014   AST 17 10/16/2017   ALT 11 10/16/2017   PROT 7.9 10/16/2017   ALBUMIN 3.9 07/14/2014   CALCIUM 9.7 10/16/2017   GFRAA 107 10/16/2017    Speciality Comments: No specialty comments available.  Procedures:  No procedures performed Allergies: Tramadol   Assessment / Plan:     Visit Diagnoses: Positive ANA (antinuclear antibody) - Repeat ANA 1: 320 speckled and ENA  negative.  AVISE index 0.4.U/s 12/11/17 negative for synovitis: She has no clinical features of autoimmune disease at this time.  She has no synovitis on exam ultrasound performed on 12/11/2017 of bilateral hands was negative for synovitis.  She is a rash intermittently on the right eyelid and glabella which resolves with hydrocortisone cream.  She has no oral or nasal ulcerations on exam.  She has no symptoms of Raynaud's.  She experiences eye dryness which she attributes to contact use.  No mouth dryness.  She has not had any hair loss or sensitivity to sunlight.  She was advised to notify us if she develops any new or worsening symptoms.  She will follow-up in the office in 6 months.  Rash - She has a rash top of her right eyelid and glabella intermittently.  No rash apparent today.  Mild hyperpigmentation noted.  The rash resolves with use of hydrocortisone cream when she applies it on a regular basis.  If the rash  persists she was encouraged to follow-up with a dermatologist.  Pain in both wrists: She has tenderness on the ulnar aspect of bilateral wrist joints.  No synovitis was noted.  She is good range of motion.  She had ultrasound of bilateral hands performed on 12/11/2017 that was negative for synovitis.  She had enlargement of bilateral median nerves at that time.  She was advised to notify us if she develops increased joint pain or joint swelling.  Achilles tendinitis of both lower extremities: She has bilateral Achilles tendinitis.  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  She has no ankle joint tenderness or swelling.  She was encouraged to continue to stretch on a regular basis.  She has no plantar fasciitis.  She is no history of psoriasis.  Other fatigue: Chronic   Other medical conditions are listed as follows:  Acquired hypothyroidism  Hx of migraine headaches  History of asthma  History of type 2 diabetes mellitus  History of anemia  History of endometriosis   Orders: No orders of the defined types were placed in this encounter.  No orders of the defined types were placed in this encounter.    Follow-Up Instructions: Return in about 6 months (around 07/24/2018) for Positive ANA, Polyarthralgia, rash.   Gearldine Bienenstock, PA-C   I examined and evaluated the patient with Sherron Ales PA.  Patient did not have recurrence of rash.  She continues to have some fatigue.  She had no synovitis on my examination today.  There is no history of raynauds phenomenon oral ulcers or nasal ulcers..  Use of sunscreen was discussed.  She will contact us in case she has worsening of her symptoms.  The plan of care was discussed as noted above.  Pollyann Savoy, MD  Note - This record has been created using Animal nutritionist.  Chart creation errors have been sought, but may not always  have been located. Such creation errors do not reflect on  the standard of medical  care.

## 2018-01-23 ENCOUNTER — Encounter: Payer: Self-pay | Admitting: Rheumatology

## 2018-01-23 ENCOUNTER — Ambulatory Visit: Payer: BLUE CROSS/BLUE SHIELD | Admitting: Rheumatology

## 2018-01-23 VITALS — BP 120/87 | HR 70 | Ht 66.0 in | Wt 271.6 lb

## 2018-01-23 DIAGNOSIS — M25532 Pain in left wrist: Secondary | ICD-10-CM

## 2018-01-23 DIAGNOSIS — M25531 Pain in right wrist: Secondary | ICD-10-CM

## 2018-01-23 DIAGNOSIS — Z8669 Personal history of other diseases of the nervous system and sense organs: Secondary | ICD-10-CM

## 2018-01-23 DIAGNOSIS — R21 Rash and other nonspecific skin eruption: Secondary | ICD-10-CM | POA: Diagnosis not present

## 2018-01-23 DIAGNOSIS — R5383 Other fatigue: Secondary | ICD-10-CM

## 2018-01-23 DIAGNOSIS — Z862 Personal history of diseases of the blood and blood-forming organs and certain disorders involving the immune mechanism: Secondary | ICD-10-CM

## 2018-01-23 DIAGNOSIS — Z8742 Personal history of other diseases of the female genital tract: Secondary | ICD-10-CM

## 2018-01-23 DIAGNOSIS — Z8709 Personal history of other diseases of the respiratory system: Secondary | ICD-10-CM

## 2018-01-23 DIAGNOSIS — E039 Hypothyroidism, unspecified: Secondary | ICD-10-CM

## 2018-01-23 DIAGNOSIS — Z8639 Personal history of other endocrine, nutritional and metabolic disease: Secondary | ICD-10-CM

## 2018-01-23 DIAGNOSIS — M7662 Achilles tendinitis, left leg: Secondary | ICD-10-CM

## 2018-01-23 DIAGNOSIS — M7661 Achilles tendinitis, right leg: Secondary | ICD-10-CM | POA: Diagnosis not present

## 2018-01-23 DIAGNOSIS — R768 Other specified abnormal immunological findings in serum: Secondary | ICD-10-CM

## 2018-02-04 DIAGNOSIS — N809 Endometriosis, unspecified: Secondary | ICD-10-CM | POA: Diagnosis not present

## 2018-02-04 DIAGNOSIS — R102 Pelvic and perineal pain: Secondary | ICD-10-CM | POA: Diagnosis not present

## 2018-03-18 DIAGNOSIS — E119 Type 2 diabetes mellitus without complications: Secondary | ICD-10-CM | POA: Diagnosis not present

## 2018-03-18 DIAGNOSIS — Z13820 Encounter for screening for osteoporosis: Secondary | ICD-10-CM | POA: Diagnosis not present

## 2018-03-18 DIAGNOSIS — J45909 Unspecified asthma, uncomplicated: Secondary | ICD-10-CM | POA: Diagnosis not present

## 2018-03-18 DIAGNOSIS — N809 Endometriosis, unspecified: Secondary | ICD-10-CM | POA: Diagnosis not present

## 2018-03-19 DIAGNOSIS — R05 Cough: Secondary | ICD-10-CM | POA: Diagnosis not present

## 2018-03-26 DIAGNOSIS — Z6841 Body Mass Index (BMI) 40.0 and over, adult: Secondary | ICD-10-CM | POA: Diagnosis not present

## 2018-03-26 DIAGNOSIS — Z3040 Encounter for surveillance of contraceptives, unspecified: Secondary | ICD-10-CM | POA: Diagnosis not present

## 2018-03-26 DIAGNOSIS — N809 Endometriosis, unspecified: Secondary | ICD-10-CM | POA: Diagnosis not present

## 2018-03-26 DIAGNOSIS — Z01419 Encounter for gynecological examination (general) (routine) without abnormal findings: Secondary | ICD-10-CM | POA: Diagnosis not present

## 2018-06-26 IMAGING — DX DG CHEST 2V
2 series · 2 of 2 positions shown · non-contrast
Comparison: 06/28/2015

CLINICAL DATA: Chest congestion for 6 days

EXAM:
CHEST  2 VIEW

[chest pa]
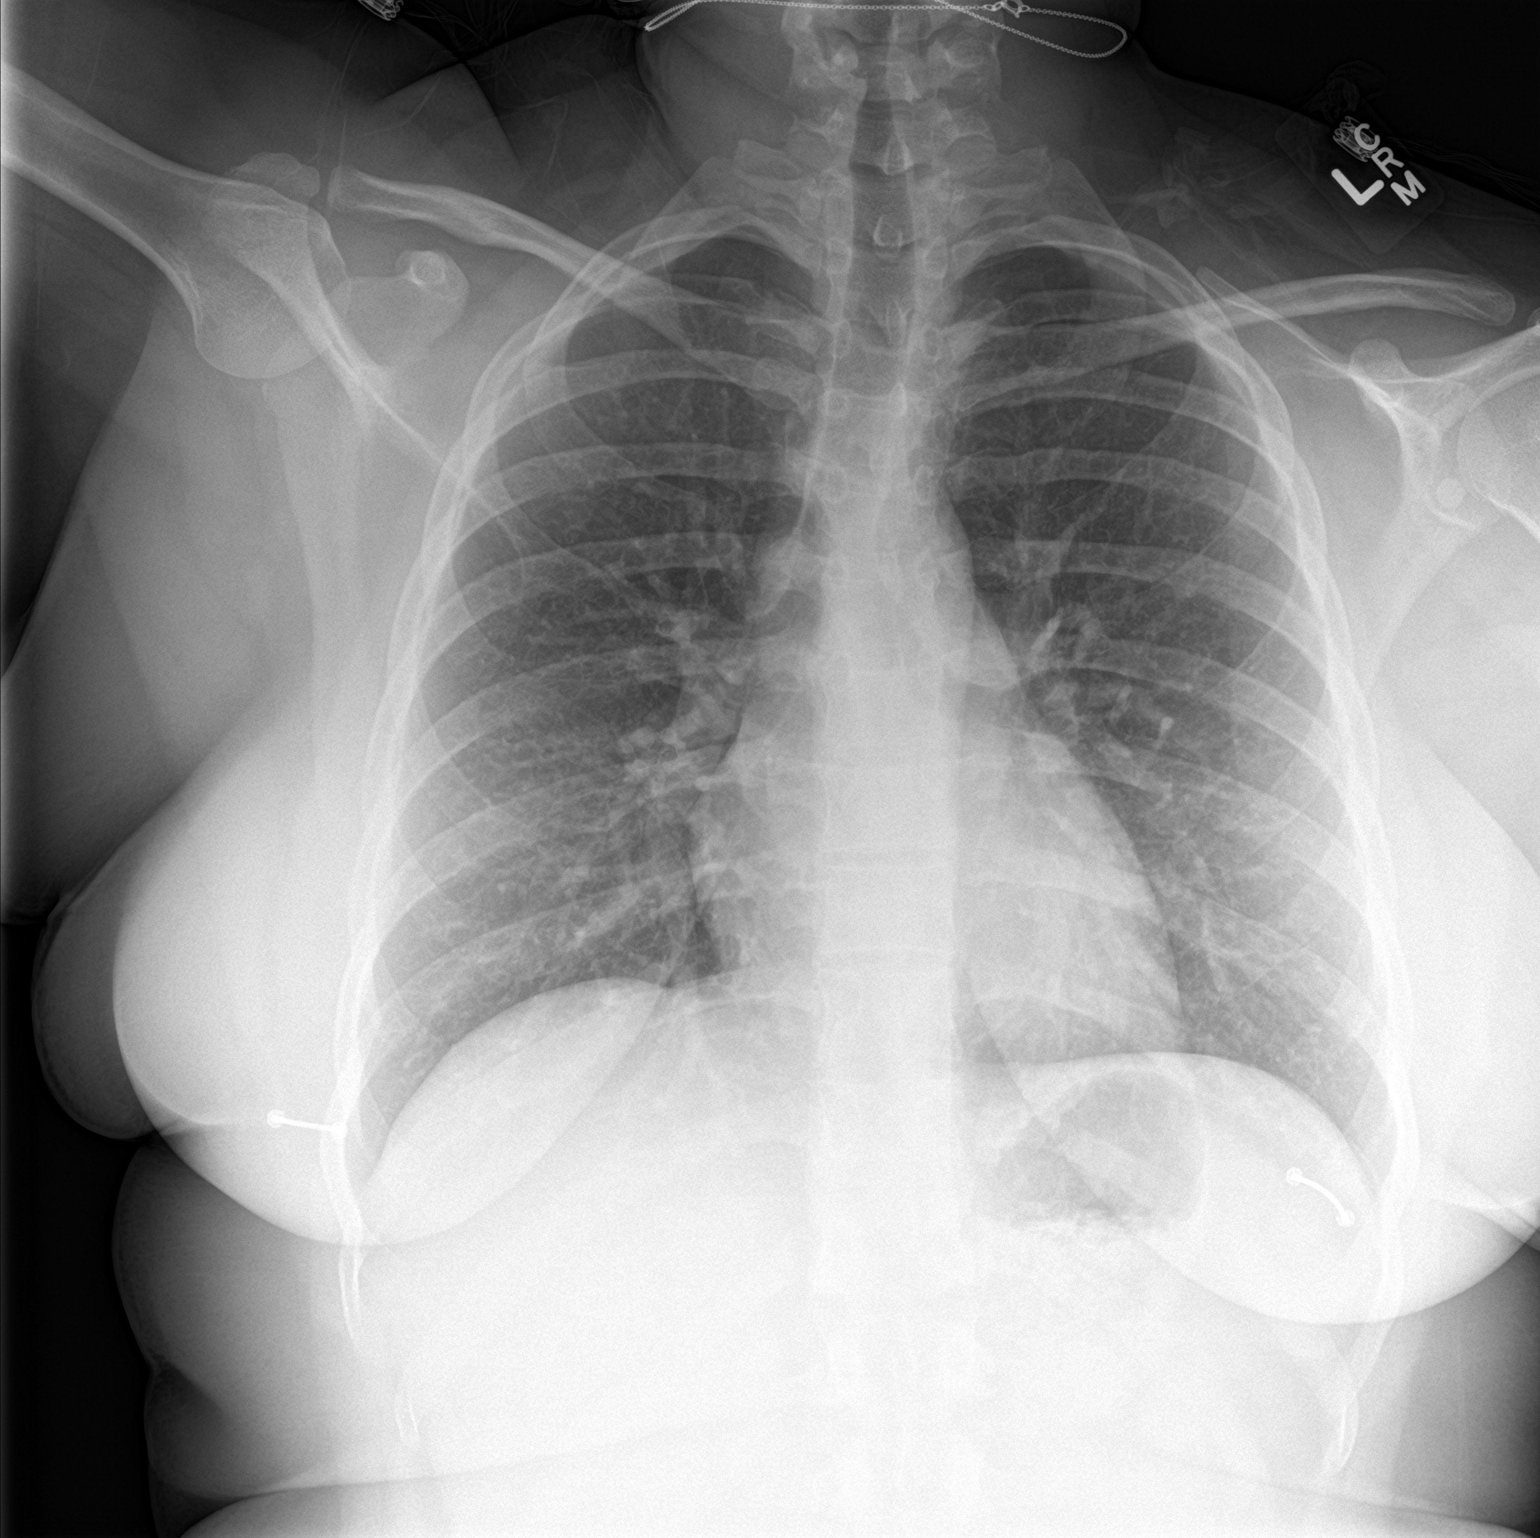

[chest lat]
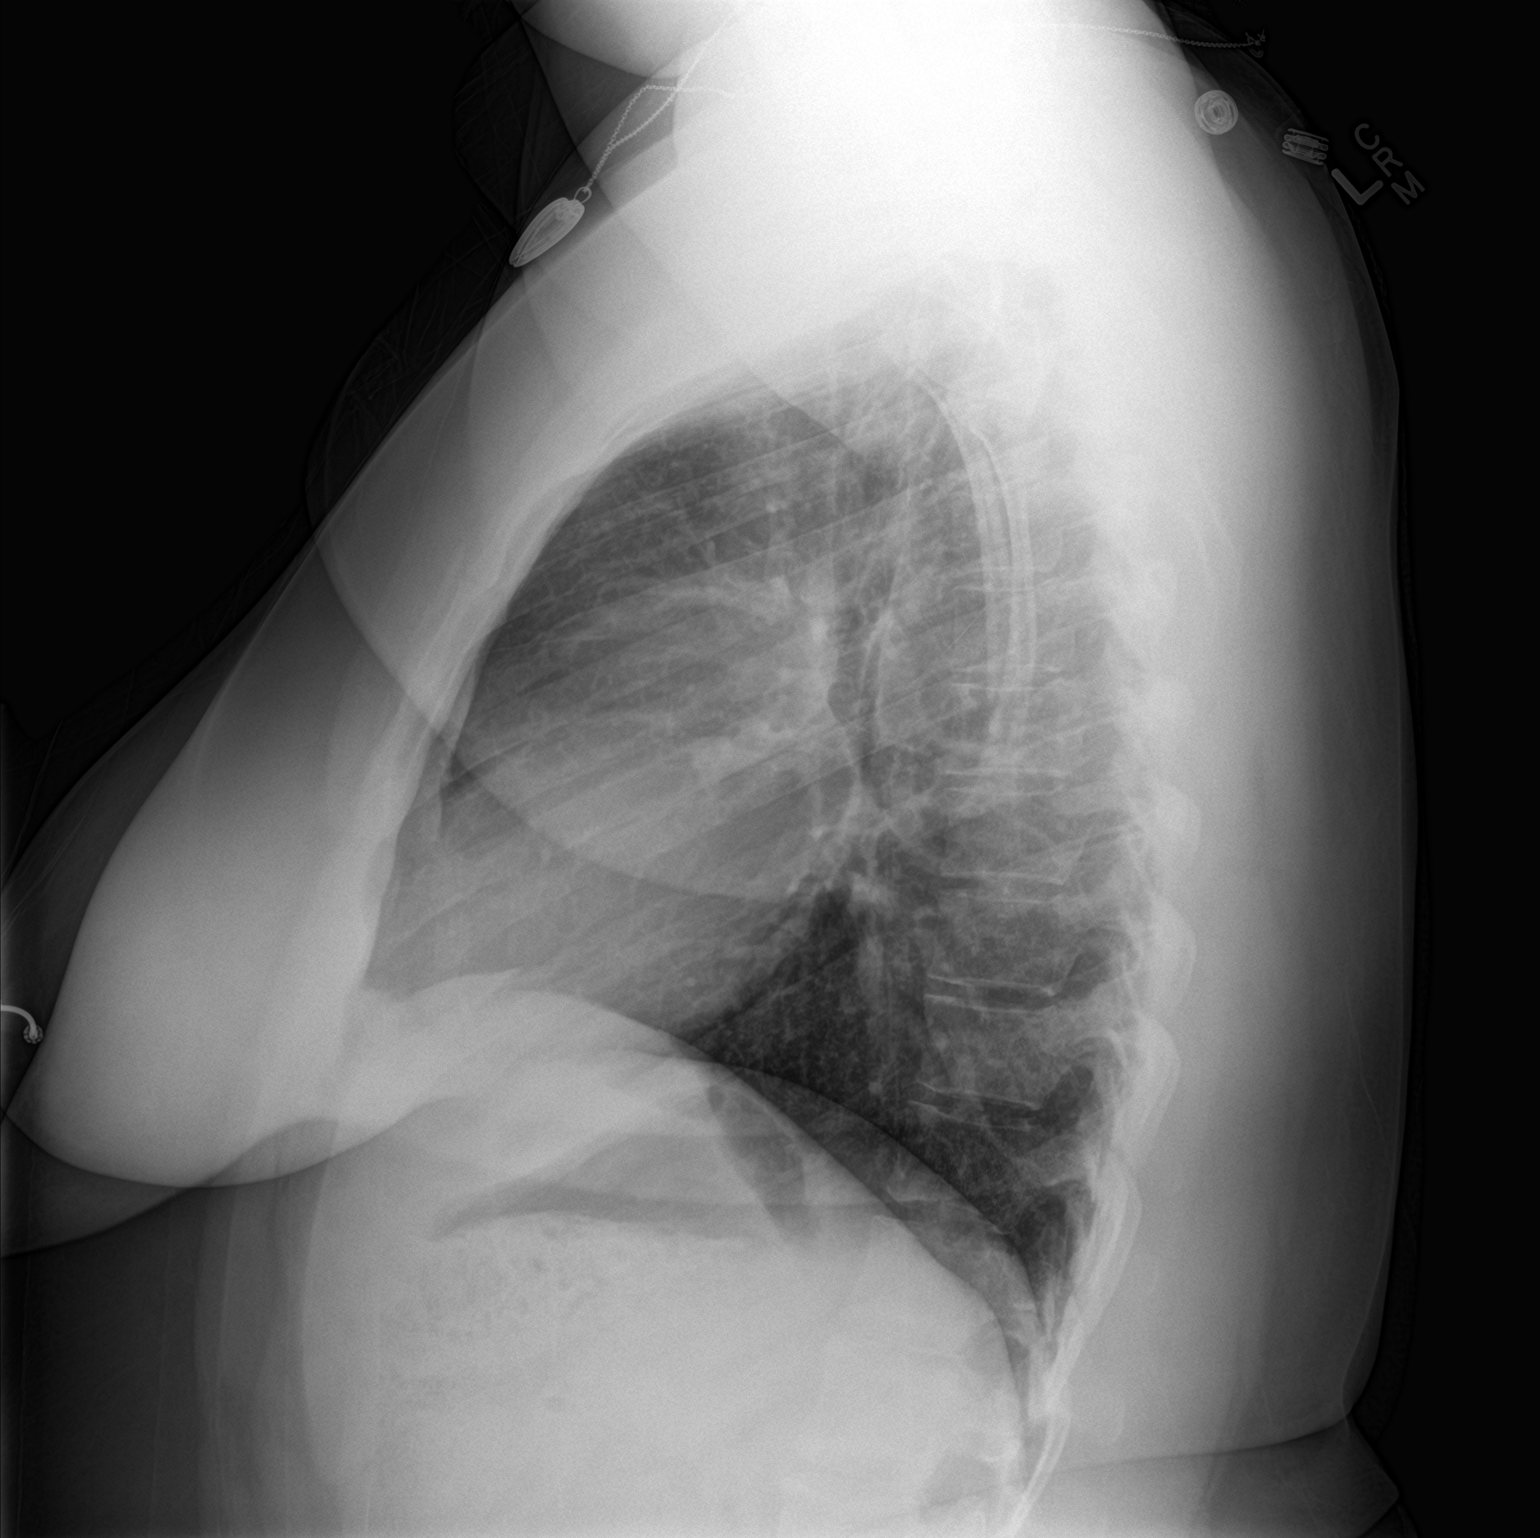

[2 of 2 positions shown; findings below may reference images not displayed]

FINDINGS: The heart size and mediastinal contours are within normal limits.
Both lungs are clear. The visualized skeletal structures are
unremarkable.
IMPRESSION: No active cardiopulmonary disease.

## 2018-07-16 NOTE — Progress Notes (Deleted)
Office Visit Note  Patient: Ashlee Herman             Date of Birth: 10/23/1990           MRN: [id number]             PCP: Tillman Sersiccio, Angela C, DO Referring: Tillman Sersiccio, Angela C, DO Visit Date: 07/24/2018 Occupation: @GUAROCC @  Subjective:  No chief complaint on file.   History of Present Illness: Ashlee Herman is a 28 y.o. female ***   Activities of Daily Living:  Patient reports morning stiffness for *** {minute/hour:19697}.   Patient {ACTIONS;DENIES/REPORTS:21021675::"Denies"} nocturnal pain.  Difficulty dressing/grooming: {ACTIONS;DENIES/REPORTS:21021675::"Denies"} Difficulty climbing stairs: {ACTIONS;DENIES/REPORTS:21021675::"Denies"} Difficulty getting out of chair: {ACTIONS;DENIES/REPORTS:21021675::"Denies"} Difficulty using hands for taps, buttons, cutlery, and/or writing: {ACTIONS;DENIES/REPORTS:21021675::"Denies"}  No Rheumatology ROS completed.   PMFS History:  Patient Active Problem List   Diagnosis Date Noted  . Generalized anxiety disorder 02/06/2017  . Intractable migraine with aura without status migrainosus 05/24/2016  . Mild persistent asthma without complication 03/19/2016  . Prediabetes   . Obesity   . Goiter   . Hypertension   . Thyroiditis, autoimmune   . Dyspepsia   . Hypothyroid   . Fainting episodes   . Pre-diabetes 06/12/2010  . Other specified acquired hypothyroidism 06/12/2010    Past Medical History:  Diagnosis Date  . Asthma 11/2009  . Diabetes mellitus   . Dyspepsia   . Fainting episodes   . Goiter   . H/O dysmenorrhea 11/2009  . H/O: menorrhagia 11/2009  . Hypertension   . Hypothyroid   . Obesity   . Thyroid activity decreased   . Thyroiditis, autoimmune     Family History  Problem Relation Age of Onset  . Obesity Mother   . Hypertension Mother   . Obesity Father   . Cancer Maternal Grandmother   . Thyroid disease Neg Hx    Past Surgical History:  Procedure Laterality Date  . CLEFT PALATE REPAIR    . TYMPANOSTOMY TUBE  PLACEMENT     Social History   Social History Narrative  . Not on file   Immunization History  Administered Date(s) Administered  . Influenza, Seasonal, Injecte, Preservative Fre 12/06/2016  . Influenza-Unspecified 12/09/2017     Objective: Vital Signs: There were no vitals taken for this visit.   Physical Exam   Musculoskeletal Exam: ***  CDAI Exam: CDAI Score: Not documented Patient Global Assessment: Not documented; Provider Global Assessment: Not documented Swollen: Not documented; Tender: Not documented Joint Exam   Not documented   There is currently no information documented on the homunculus. Go to the Rheumatology activity and complete the homunculus joint exam.  Investigation: No additional findings.  Imaging: No results found.  Recent Labs: Lab Results  Component Value Date   WBC 5.6 10/16/2017   HGB 13.2 10/16/2017   PLT 330 10/16/2017   NA 138 10/16/2017   K 4.4 10/16/2017   CL 103 10/16/2017   CO2 27 10/16/2017   GLUCOSE 98 10/16/2017   BUN 11 10/16/2017   CREATININE 0.87 10/16/2017   BILITOT 0.3 10/16/2017   ALKPHOS 42 07/14/2014   AST 17 10/16/2017   ALT 11 10/16/2017   PROT 7.9 10/16/2017   ALBUMIN 3.9 07/14/2014   CALCIUM 9.7 10/16/2017   GFRAA 107 10/16/2017    Speciality Comments: No specialty comments available.  Procedures:  No procedures performed Allergies: Tramadol   Assessment / Plan:     Visit Diagnoses: Positive ANA (antinuclear antibody)  Rash  Orders: No orders of the defined types were placed in this encounter.  No orders of the defined types were placed in this encounter.   Face-to-face time spent with patient was *** minutes. Greater than 50% of time was spent in counseling and coordination of care.  Follow-Up Instructions: No follow-ups on file.   Gearldine Bienenstock, PA-C  Note - This record has been created using Dragon software.  Chart creation errors have been sought, but may not always  have been  located. Such creation errors do not reflect on  the standard of medical care.

## 2018-07-24 ENCOUNTER — Ambulatory Visit: Payer: Self-pay | Admitting: Physician Assistant

## 2018-07-29 NOTE — Progress Notes (Signed)
Office Visit Note  Patient: Ashlee Herman             Date of Birth: Nov 02, 1990           MRN: [id number]             PCP: Tillman Sers, DO Referring: Tillman Sers, DO Visit Date: 08/11/2018 Occupation: @GUAROCC @  Subjective:  Achilles tendonitis bilaterally   History of Present Illness: Ashlee Herman is a 28 y.o. female with history of positive ANA and rash.  She denies any new or worsening symptoms.  She continues to have bilateral Achilles tendinitis.  She states experiences discomfort and stiffness for about 10 minutes every morning.  She states that it on bad days she wears a brace to work.  She denies any ankle joint pain or swelling.  She states in the past she took meloxicam but does not notice any difference since discontinuing.  She takes ibuprofen as needed for pain relief.  She also has been stretching on a regular basis and wearing supportive shoes.  She denies any plantar fasciitis, SI joint pain, or psoriasis.   She denies any other joint pain or joint swelling at this time.  She reports her level of fatigue has been stable.  She states that the rash she experienced on her face has resolved and she has not had any recurrences.  She states that she does have hydrocortisone cream which she can apply if the rash returns.  She denies any photosensitivity.  She denies any sores in her mouth or nose.  She does have eye dryness but no mouth dryness.  She denies any symptoms of Raynaud's.   Activities of Daily Living:  Patient reports morning stiffness for 10 minutes.   Patient Denies nocturnal pain.  Difficulty dressing/grooming: Denies Difficulty climbing stairs: Denies Difficulty getting out of chair: Denies Difficulty using hands for taps, buttons, cutlery, and/or writing: Denies  Review of Systems  Constitutional: Positive for fatigue.  HENT: Negative for mouth sores, mouth dryness and nose dryness.   Eyes: Positive for dryness. Negative for pain and visual  disturbance.  Respiratory: Negative for cough, hemoptysis, shortness of breath and difficulty breathing.   Cardiovascular: Negative for chest pain, palpitations, hypertension and swelling in legs/feet.  Gastrointestinal: Negative for blood in stool, constipation and diarrhea.  Endocrine: Negative for increased urination.  Genitourinary: Negative for painful urination.  Musculoskeletal: Positive for arthralgias, joint pain and morning stiffness. Negative for joint swelling, myalgias, muscle weakness, muscle tenderness and myalgias.  Skin: Negative for color change, pallor, rash, hair loss, nodules/bumps, skin tightness, ulcers and sensitivity to sunlight.  Allergic/Immunologic: Negative for susceptible to infections.  Neurological: Negative for dizziness, numbness, headaches and weakness.  Hematological: Negative for swollen glands.  Psychiatric/Behavioral: Positive for sleep disturbance. Negative for depressed mood. The patient is not nervous/anxious.     PMFS History:  Patient Active Problem List   Diagnosis Date Noted   Generalized anxiety disorder 02/06/2017   Intractable migraine with aura without status migrainosus 05/24/2016   Mild persistent asthma without complication 03/19/2016   Prediabetes    Obesity    Goiter    Hypertension    Thyroiditis, autoimmune    Dyspepsia    Hypothyroid    Fainting episodes    Pre-diabetes 06/12/2010   Other specified acquired hypothyroidism 06/12/2010    Past Medical History:  Diagnosis Date   Asthma 11/2009   Diabetes mellitus    Dyspepsia    Fainting episodes  Goiter    H/O dysmenorrhea 11/2009   H/O: menorrhagia 11/2009   Hypertension    Hypothyroid    Obesity    Thyroid activity decreased    Thyroiditis, autoimmune     Family History  Problem Relation Age of Onset   Obesity Mother    Hypertension Mother    Obesity Father    Cancer Maternal Grandmother    Thyroid disease Neg Hx    Past  Surgical History:  Procedure Laterality Date   CLEFT PALATE REPAIR     TYMPANOSTOMY TUBE PLACEMENT     Social History   Social History Narrative   Not on file   Immunization History  Administered Date(s) Administered   Influenza, Seasonal, Injecte, Preservative Fre 12/06/2016   Influenza-Unspecified 12/09/2017     Objective: Vital Signs: BP 126/85 (BP Location: Left Arm, Patient Position: Sitting, Cuff Size: Large)    Pulse 80    Resp 14    Ht 5\' 6"  (1.676 m)    Wt 278 lb (126.1 kg)    BMI 44.87 kg/m    Physical Exam Vitals signs and nursing note reviewed.  Constitutional:      Appearance: She is well-developed.  HENT:     Head: Normocephalic and atraumatic.  Eyes:     Conjunctiva/sclera: Conjunctivae normal.  Neck:     Musculoskeletal: Normal range of motion.  Cardiovascular:     Rate and Rhythm: Normal rate and regular rhythm.     Heart sounds: Normal heart sounds.  Pulmonary:     Effort: Pulmonary effort is normal.     Breath sounds: Normal breath sounds.  Abdominal:     General: Bowel sounds are normal.     Palpations: Abdomen is soft.  Lymphadenopathy:     Cervical: No cervical adenopathy.  Skin:    General: Skin is warm and dry.     Capillary Refill: Capillary refill takes less than 2 seconds.  Neurological:     Mental Status: She is alert and oriented to person, place, and time.  Psychiatric:        Behavior: Behavior normal.      Musculoskeletal Exam: C-spine, thoracic spine, lumbar spine good range of motion.  No midline spinal tenderness.  No SI joint tenderness.  Shoulder joints, elbow joints, wrist joints, MCPs, PIPs, DIPs good range of motion no synovitis.  She has complete fist formation bilaterally.  Hip joints, knee joints, ankle joints, MTPs, PIPs, DIPs good good range of motion with no synovitis.  No warmth or effusion bilateral knee joints.  No tenderness or swelling of ankle joints.  She has tenderness of bilateral Achilles tendons.  No  plantar fasciitis.  No tenderness over trochanteric bursa bilaterally.  CDAI Exam: CDAI Score: Not documented Patient Global Assessment: Not documented; Provider Global Assessment: Not documented Swollen: Not documented; Tender: Not documented Joint Exam   Not documented   There is currently no information documented on the homunculus. Go to the Rheumatology activity and complete the homunculus joint exam.  Investigation: No additional findings.  Imaging: No results found.  Recent Labs: Lab Results  Component Value Date   WBC 5.6 10/16/2017   HGB 13.2 10/16/2017   PLT 330 10/16/2017   NA 138 10/16/2017   K 4.4 10/16/2017   CL 103 10/16/2017   CO2 27 10/16/2017   GLUCOSE 98 10/16/2017   BUN 11 10/16/2017   CREATININE 0.87 10/16/2017   BILITOT 0.3 10/16/2017   ALKPHOS 42 07/14/2014   AST 17 10/16/2017  ALT 11 10/16/2017   PROT 7.9 10/16/2017   ALBUMIN 3.9 07/14/2014   CALCIUM 9.7 10/16/2017   GFRAA 107 10/16/2017    Speciality Comments: No specialty comments available.  Procedures:  No procedures performed Allergies: Tramadol   Assessment / Plan:     Visit Diagnoses: Positive ANA (antinuclear antibody) - Repeat ANA 1: 320 speckled and ENA negative.  AVISE index 0.4.U/s 12/11/17 negative for synovitis: She has no clinical features of autoimmune disease at this time.  She has no synovitis on exam.  She has not developed any new or worsening symptoms, but she was advised to notify us if she does.  She will follow up in 6 months and we will recheck autoimmune labs at that time.   Rash - She previously had a rash top of her right eyelid and glabella.  She has not had any recurrences.  She has hydrocortisone cream to apply topically if the rash develops again.  No Malar rash was noted on exam today.  She was encouraged to wear sunscreen spf 50 if she plans on being in the sun.  Achilles tendinitis of both lower extremities: She continues to have symptoms of bilateral  Achilles tendinitis.  She has tenderness on exam today.  She experiences discomfort and stiffness for about 10 minutes every morning.  She experiences more severe symptoms throughout the day if she is walking for prolonged distances.  She occasionally will wear a brace.  She has been trying to perform stretching exercises and wearing supportive shoes.  She was given a handout of exercises.  She declined physical therapy at this time.  She has not had any plantar fasciitis, SI joint pain, or psoriasis.  She can try using Voltaren gel topically and icing if her symptoms persist.  Other fatigue: Stable  Other medical conditions are listed as follows:   History of endometriosis  History of anemia  Hx of migraine headaches  Acquired hypothyroidism  History of type 2 diabetes mellitus  History of asthma   Orders: No orders of the defined types were placed in this encounter.  No orders of the defined types were placed in this encounter.    Follow-Up Instructions: Return in about 6 months (around 02/10/2019).   Gearldine Bienenstock, PA-C  Note - This record has been created using Dragon software.  Chart creation errors have been sought, but may not always  have been located. Such creation errors do not reflect on  the standard of medical care.

## 2018-08-11 ENCOUNTER — Encounter: Payer: Self-pay | Admitting: Physician Assistant

## 2018-08-11 ENCOUNTER — Ambulatory Visit: Payer: BLUE CROSS/BLUE SHIELD | Admitting: Physician Assistant

## 2018-08-11 ENCOUNTER — Other Ambulatory Visit: Payer: Self-pay

## 2018-08-11 VITALS — BP 126/85 | HR 80 | Resp 14 | Ht 66.0 in | Wt 278.0 lb

## 2018-08-11 DIAGNOSIS — Z8742 Personal history of other diseases of the female genital tract: Secondary | ICD-10-CM

## 2018-08-11 DIAGNOSIS — R21 Rash and other nonspecific skin eruption: Secondary | ICD-10-CM | POA: Diagnosis not present

## 2018-08-11 DIAGNOSIS — R768 Other specified abnormal immunological findings in serum: Secondary | ICD-10-CM

## 2018-08-11 DIAGNOSIS — Z8669 Personal history of other diseases of the nervous system and sense organs: Secondary | ICD-10-CM

## 2018-08-11 DIAGNOSIS — Z8709 Personal history of other diseases of the respiratory system: Secondary | ICD-10-CM

## 2018-08-11 DIAGNOSIS — M7661 Achilles tendinitis, right leg: Secondary | ICD-10-CM

## 2018-08-11 DIAGNOSIS — Z862 Personal history of diseases of the blood and blood-forming organs and certain disorders involving the immune mechanism: Secondary | ICD-10-CM

## 2018-08-11 DIAGNOSIS — M7662 Achilles tendinitis, left leg: Secondary | ICD-10-CM

## 2018-08-11 DIAGNOSIS — Z8639 Personal history of other endocrine, nutritional and metabolic disease: Secondary | ICD-10-CM

## 2018-08-11 DIAGNOSIS — R5383 Other fatigue: Secondary | ICD-10-CM | POA: Diagnosis not present

## 2018-08-11 DIAGNOSIS — E039 Hypothyroidism, unspecified: Secondary | ICD-10-CM

## 2018-08-11 NOTE — Patient Instructions (Signed)
.  [facility]  Ask your health care provider which exercises are safe for you. Do exercises exactly as told by your health care provider and adjust them as directed. It is normal to feel mild stretching, pulling, tightness, or discomfort as you do these exercises, but you should stop right away if you feel sudden pain or your pain gets worse. Do not begin these exercises until told by your health care provider.  Stretching and range of motion exercises  These exercises warm up your muscles and joints and improve the movement and flexibility of your ankle. These exercises also help to relieve pain, numbness, and tingling.  Exercise A: Standing wall calf stretch, knee straight    1. Stand with your hands against a wall.  2. Extend your __________ leg behind you and bend your front knee slightly. Keep both of your heels on the floor.  3. Point the toes of your back foot slightly inward.  4. Keeping your heels on the floor and your back knee straight, shift your weight toward the wall. Do not allow your back to arch. You should feel a gentle stretch in your calf.  5. Hold this position for seconds.  Repeat __________ times. Complete this stretch __________ times per day.  Exercise B: Standing wall calf stretch, knee bent  1. Stand with your hands against a wall.  2. Extend your __________ leg behind you, and bend your front knee slightly. Keep both of your heels on the floor.  3. Point the toes of your back foot slightly inward.  4. Keeping your heels on the floor, unlock your back knee so that it is bent. You should feel a gentle stretch deep in your calf.  5. Hold this position for __________ seconds.  Repeat __________ times. Complete this stretch __________ times per day.  Strengthening exercises  These exercises build strength and control of your ankle. Endurance is the ability to use your muscles for a long time, even after they get tired.  Exercise C: Plantar flexion with band    1. Sit on the  floor with your __________ leg extended. You may put a pillow under your calf to give your foot more room to move.  2. Loop a rubber exercise band or tube around the ball of your __________ foot. The ball of your foot is on the walking surface, right under your toes. The band or tube should be slightly tense when your foot is relaxed. If the band or tube slips, you can put on your shoe or put a washcloth between the band and your foot to help it stay in place.  3. Slowly point your toes downward, pushing them away from you.  4. Hold this position for __________ seconds.  5. Slowly release the tension in the band or tube, controlling smoothly until your foot is back to the starting position.  Repeat __________ times. Complete this exercise __________ times per day.  Exercise D: Heel raise with eccentric lower    1. Stand on a step with the balls of your feet. The ball of your foot is on the walking surface, right under your toes.  ? Do not put your heels on the step.  ? For balance, rest your hands on the wall or on a railing.  2. Rise up onto the balls of your feet.  3. Keeping your heels up, shift all of your weight to your __________ leg and pick up your other leg.  4. Slowly lower   your __________ leg so your heel drops below the level of the step.  5. Put down your foot.  If told by your health care provider, build up to:  · 3 sets of 15 repetitions while keeping your knees straight.  · 3 sets of 15 repetitions while keeping your knees bent as far as told by your health care provider.  Complete this exercise __________ times per day.  If this exercise is too easy, try doing it while wearing a backpack with weights in it.  Balance exercises  These exercises improve or maintain your balance. Balance is important in preventing falls.  Exercise E: Single leg stand  1. Without shoes, stand near a railing or in a door frame. Hold on to the railing or door frame as needed.  2. Stand on your __________ foot. Keep your  big toe down on the floor and try to keep your arch lifted.  3. Hold this position for __________ seconds.  Repeat __________ times. Complete this exercise __________ times per day.  If this exercise is too easy, you can try it with your eyes closed or while standing on a pillow.  This information is not intended to replace advice given to you by your health care provider. Make sure you discuss any questions you have with your health care provider.  Document Released: 09/20/2004 Document Revised: 08/06/2016 Document Reviewed: 10/26/2014  Elsevier Interactive Patient Education © 2019 Elsevier Inc.

## 2018-09-08 ENCOUNTER — Ambulatory Visit (INDEPENDENT_AMBULATORY_CARE_PROVIDER_SITE_OTHER): Payer: BC Managed Care – PPO | Admitting: Family Medicine

## 2018-09-08 ENCOUNTER — Encounter: Payer: Self-pay | Admitting: Family Medicine

## 2018-09-08 ENCOUNTER — Other Ambulatory Visit: Payer: Self-pay

## 2018-09-08 VITALS — BP 118/78 | HR 82

## 2018-09-08 DIAGNOSIS — R7303 Prediabetes: Secondary | ICD-10-CM | POA: Diagnosis not present

## 2018-09-08 DIAGNOSIS — E039 Hypothyroidism, unspecified: Secondary | ICD-10-CM | POA: Diagnosis not present

## 2018-09-08 DIAGNOSIS — L2089 Other atopic dermatitis: Secondary | ICD-10-CM

## 2018-09-08 LAB — POCT GLYCOSYLATED HEMOGLOBIN (HGB A1C): Hemoglobin A1C: 5.8 % — AB (ref 4.0–5.6)

## 2018-09-08 MED ORDER — TRIAMCINOLONE ACETONIDE 0.5 % EX OINT: 1.0000 "application " | TOPICAL_OINTMENT | Freq: Two times a day (BID) | CUTANEOUS | 0 refills | Status: DC

## 2018-09-08 MED ORDER — EPINEPHRINE 0.3 MG/0.3ML IJ SOAJ: 0.3000 mg | INTRAMUSCULAR | 0 refills | Status: DC | PRN

## 2018-09-08 NOTE — Patient Instructions (Addendum)
Dear Ashlee Herman,   It was very nice to see you! Thank you for taking your time to come in to be seen. Today, we discussed the following:   Rash  The areas of your rash are consistent with irritation caused by friction between skin surfaces.  Pat dry after showers.  Use cream or emollients on the surfaces to help keep the skin hydrated.  Use the triamcinolone cream 2 times a day about 30 minutes after applying cream and emollients.  Continue to do this for 1 to 2 weeks.   Please follow up next week for chronic condition follow-up.    Be well,    Dr. Zettie Cooley [facility] [phone]   Sign up for MyChart for instant access to your health profile, labs, orders, upcoming appointments or to contact your provider with questions.

## 2018-09-08 NOTE — Progress Notes (Signed)
  Established Patient - Clinic Visit Subjective  Subjective  Patient ID: MRN [id number]  Date of birth: 07-12-1990   PCP: Lyndee Hensen, MD Name: Ashlee Herman, 28 y.o. female  CC: Rash HPI Rashes located under arms, behind knees, under stomach, under breasts, and in groin area. She reports that it is very itchy. No pain associated. Present x 1 week. Appears red and bumpy. No draining or oozing from sites. Has not tried anything to make it better. Thought about using cortisone cream, but wanted to wait to come to the office. Patient's biggest concern is itching and also concerned that she had recent sexual intercourse that may have caused this (no changes in partner x 1 year).  ROS: See HPI  HISTORY Meds  Allergies: Reviewed as appropriate   Pertinent PMHx: Prediabetes, obesity, hypothyroidism Social Hx: Audreena reports that she has never smoked. She has never used smokeless tobacco. She reports current alcohol use. She reports that she does not use drugs.    Objective   Objective  Physical Exam:  BP 118/78   Pulse 82   SpO2 99%  General: NAD, non-toxic, well-appearing, sitting comfortably in chair. Scratches at rashes often.    Respiratory: No IWOB.  Extremities: Warm and well perfused. Moving spontaneously.  Integumentary: large erythematous patches of diffuse papules located on intertriginous surfaces (under b/l arms, under pannus, groin). No draining or infectious process appreciated. No maceration or foul smell. No cracks or fissues.   Pertinent Labs & Imaging:   Reviewed in chart as appropriate   Assessment  Assessment & Plan  Atopic dermatitis Risk factors: obesity, prediabetes. No hx of hyperhidrosis. Is consistent with hot weather and flexural surfaces. Less likely contact allergic dermatitis as it is on multiple bilateral surfaces or allergen contact with not necessarily be.  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   Treat with steroid cream  until improvement.   Corn starch, gold bond, baby powder on especially hot and acitve days     Wilber Oliphant, M.D.  PGY-2  Family Medicine  09/11/2018 9:39 AM

## 2018-09-09 LAB — BASIC METABOLIC PANEL
BUN/Creatinine Ratio: 10 (ref 9–23)
BUN: 9 mg/dL (ref 6–20)
CO2: 20 mmol/L (ref 20–29)
Calcium: 9.5 mg/dL (ref 8.7–10.2)
Chloride: 102 mmol/L (ref 96–106)
Creatinine, Ser: 0.91 mg/dL (ref 0.57–1.00)
GFR calc Af Amer: 101 mL/min/{1.73_m2} (ref >59)
GFR calc non Af Amer: 87 mL/min/{1.73_m2} (ref >59)
Glucose: 88 mg/dL (ref 65–99)
Potassium: 4.1 mmol/L (ref 3.5–5.2)
Sodium: 138 mmol/L (ref 134–144)

## 2018-09-09 LAB — TSH: TSH: 2.02 u[IU]/mL (ref 0.450–4.500)

## 2018-09-11 ENCOUNTER — Encounter: Payer: Self-pay | Admitting: Family Medicine

## 2018-09-11 DIAGNOSIS — L209 Atopic dermatitis, unspecified: Secondary | ICD-10-CM | POA: Insufficient documentation

## 2018-09-11 NOTE — Assessment & Plan Note (Addendum)
Risk factors: obesity, prediabetes. No hx of hyperhidrosis. Is consistent with hot weather and flexural surfaces. Less likely contact allergic dermatitis as it is on multiple bilateral surfaces or allergen contact with not necessarily be.  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   Treat with steroid cream until improvement.   Corn starch, gold bond, baby powder on especially hot and acitve days

## 2018-09-16 ENCOUNTER — Encounter: Payer: Self-pay | Admitting: Family Medicine

## 2018-09-16 ENCOUNTER — Ambulatory Visit: Payer: BC Managed Care – PPO | Admitting: Family Medicine

## 2018-09-16 ENCOUNTER — Other Ambulatory Visit: Payer: Self-pay

## 2018-09-16 DIAGNOSIS — I10 Essential (primary) hypertension: Secondary | ICD-10-CM

## 2018-09-16 DIAGNOSIS — J4530 Mild persistent asthma, uncomplicated: Secondary | ICD-10-CM

## 2018-09-16 DIAGNOSIS — E039 Hypothyroidism, unspecified: Secondary | ICD-10-CM | POA: Diagnosis not present

## 2018-09-16 DIAGNOSIS — N809 Endometriosis, unspecified: Secondary | ICD-10-CM | POA: Diagnosis not present

## 2018-09-16 DIAGNOSIS — R7303 Prediabetes: Secondary | ICD-10-CM

## 2018-09-16 MED ORDER — LEVOTHYROXINE SODIUM 25 MCG PO TABS: 25.0000 ug | ORAL_TABLET | Freq: Every day | ORAL | 3 refills | Status: DC

## 2018-09-16 MED ORDER — NORETHINDRONE 0.35 MG PO TABS: ORAL_TABLET | ORAL | 11 refills | Status: DC

## 2018-09-16 MED ORDER — TRIAMCINOLONE ACETONIDE 0.5 % EX OINT: 1.0000 "application " | TOPICAL_OINTMENT | Freq: Two times a day (BID) | CUTANEOUS | 3 refills | Status: DC

## 2018-09-16 NOTE — Progress Notes (Signed)
Established Patient -preventative visit Subjective  Subjective  Patient ID: MRN [id number]  Date of birth: Jul 03, 1990   PCP: Ashlee Hensen, MD  CC: Chronic Condition Follow up HPI: Ashlee Herman is a 28 y.o. female with past medical history significant for hypothyroidism, mild persistent asthma without complication, obesity, prediabetes who presents today with the following problems:  Prediaebtes  Patient was previously on metformin for prediabetes prescribed by Dr. Vanetta Shawl.  She reports that she stopped taking the metformin a couple months ago when she started [place] prescribed by her OB/GYN for endometriosis.  She denies any polyuria, polydipsia, vision changes.   Hypothyroidism Patient has had hypothyroidism for over a decade.  She reports starting to take Synthroid in middle school.  She stopped taking for 6-7 months and just recently restarted a month ago.  She reports feeling better on the Synthroid.  Endometriosis  Patient follows with Dr. Leo Grosser OB/GYN and her Pap smears are done here.  She reports having a Pap smear in January 2020 which was normal.  She denies any history of abnormal Pap smears.  Health maintenance Patient works at [employer] and reports that she will get Tdap at her work as she gets points for it.  Asthma Asthma is well controlled currently.  She reports last time she had to use her inhaler was when she got the flu in December.  HISTORY Medications, allergies, medical history, family history and social history were reviewed and edited as necessary. Pertinent findings included in HPI.  Social Hx: Ashlee Herman reports that she has never smoked. She has never used smokeless tobacco. She reports current alcohol use. She reports that she does not use drugs. ROS: See HPI    Objective   Objective  Physical Exam:  BP 114/86   Pulse 76   SpO2 100%  General: NAD, non-toxic, well-appearing, sitting comfortably on exam table, wearing scrubs, obese, pleasant HEENT:  Ashlee Herman/AT. PERRLA. EOMI.  Cardiovascular: RRR, normal S1, S2. B/L 2+ RP. trace BLEE Respiratory: CTAB. No IWOB.  Abdomen: + BS. NT, ND, soft to palpation.  Extremities: Warm and well perfused. Moving spontaneously.  Integumentary: Improving rash on intertriginous areas. New tattoo on left medial ankle.  Neuro: CN grossly intact. No FND  Pertinent Labs & Imaging:  TSH wnl  A1C 5.8     Assessment  Assessment & Plan  Endometriosis Follows with Dr. Leo Grosser, who recently retired, but other providers remain in her office.  Will obtain records for recent Pap smears.  Patient encouraged to follow-up with OB/GYN provider to continue [place].  Refilled norethindrone   Hypothyroid TSH within normal limits.  Patient reports going off of Synthroid for about 6 months.  She has recently restarted a month ago.  She reports feeling better while being on the Synthroid.  She has had to take Synthroid since about middle school and has never changed doses.  Previously followed by Dr. Tobe Sos in pediatrics.  Refilled Synthroid 25 mcg  Follow-up 1 year to recheck TSH  Pre-diabetes Discontinue metformin when she started [place] in March.  A1c 5.8 at last check.  Lifestyle modifications   recheck in 3 to 6 months   Hypertension Blood pressure is within normal limits.  Last 3 blood pressures have been normal  Mild persistent asthma without complication Currently well controlled.  Has not used inhaler since December 2019 when she had the flu.  Usually only uses with upper respiratory infections.      Wilber Oliphant, M.D.  PGY-2  Family Medicine  09/16/2018  9:25 AM

## 2018-09-16 NOTE — Assessment & Plan Note (Addendum)
Currently well controlled.  Has not used inhaler since December 2019 when she had the flu.  Usually only uses with upper respiratory infections.

## 2018-09-16 NOTE — Patient Instructions (Signed)
Dear Ashlee Herman,   It was good to see you! Thank you for taking your time to come in to be seen. Today, we discussed the following:   Prediabetes  Your A1c was 5.8 today.  Please continue to work on diet and lifestyle modifications to help keep your sugars down.  We will recheck this in 6 months.   Thyroid  Your TSH numbers look great today.  Birth control  I sent a refill of the norethindrone to the pharmacy.  If you continue to take the Freida Busman, it would be good to establish with another OB/GYN  We will obtain records from your OB/GYN office to update your is here  Other  Please fax [place] the information on the Tdap  Please follow up in 6 months or sooner for concerning or worsening symptoms.   Be well,   Zettie Cooley, M.D   [facility] [phone]  *Sign up for MyChart for instant access to your health profile, labs, orders, upcoming appointments or to contact your provider with questions*  =================================================================================== Some important items for Ahna's health:    Your Blood Pressure.  Should be LESS THAN 140/90 or 150/90 if you are over 65 BP Readings from Last 3 Encounters:  09/16/18 114/86  09/08/18 118/78  08/11/18 126/85    Your Weight History Wt Readings from Last 3 Encounters:  08/11/18 278 lb (126.1 kg)  01/23/18 271 lb 9.6 oz (123.2 kg)  11/21/17 269 lb (122 kg)    There is no height or weight on file to calculate BMI.  BMI Classes Classification BMI Category (kg/m2)  Underweight < 18.5  Normal Weight 18.5-24.9  Overweight  25.0-29.9  Obese Class I 30.0-34.9  Obese Class II 35.0-39.9  Obese Class III  > or  = 40.0      Your last A1C     Every 3-6 months if you have diabetes Lab Results  Component Value Date   HGBA1C 5.8 (A) 09/08/2018    Your last Cholesterol   Every 1-5 years No results found for: CHOL, HDL, LDLCALC, LDLDIRECT  Your last Blood Tests -  Once a year  if you take medications    Component Value Date/Time   K 4.1 09/08/2018 1708   CREATININE 0.91 09/08/2018 1708   CREATININE 0.87 10/16/2017 1052   GLUCOSE 88 09/08/2018 1708   GLUCOSE 98 10/16/2017 1052    To Keep You Healthy Your are due for the following Health Maintenance Items:  Health Maintenance Due  Topic Date Due  . URINE MICROALBUMIN  09/03/2002  . HIV Screening  09/03/2007  . TETANUS/TDAP  09/03/2011  . PAP-Cervical Cytology Screening  09/02/2013  . PAP SMEAR-Modifier  09/02/2013    Please schedule an appointment with your healthcare provider for any questions or concerns regarding your health or any of the items above.

## 2018-09-16 NOTE — Assessment & Plan Note (Signed)
Follows with Dr. Leo Grosser, who recently retired, but other providers remain in her office.  Will obtain records for recent Pap smears.  Patient encouraged to follow-up with OB/GYN provider to continue [place].  Refilled norethindrone

## 2018-09-16 NOTE — Assessment & Plan Note (Addendum)
TSH within normal limits.  Patient reports going off of Synthroid for about 6 months.  She has recently restarted a month ago.  She reports feeling better while being on the Synthroid.  She has had to take Synthroid since about middle school and has never changed doses.  Previously followed by Dr. Tobe Sos in pediatrics.  Refilled Synthroid 25 mcg  Follow-up 1 year to recheck TSH

## 2018-09-16 NOTE — Assessment & Plan Note (Signed)
Blood pressure is within normal limits.  Last 3 blood pressures have been normal

## 2018-09-16 NOTE — Assessment & Plan Note (Signed)
Discontinue metformin when she started [place] in March.  A1c 5.8 at last check.  Lifestyle modifications   recheck in 3 to 6 months

## 2018-10-06 ENCOUNTER — Encounter: Payer: Self-pay | Admitting: Family Medicine

## 2018-10-06 ENCOUNTER — Other Ambulatory Visit: Payer: Self-pay

## 2018-10-06 ENCOUNTER — Telehealth (INDEPENDENT_AMBULATORY_CARE_PROVIDER_SITE_OTHER): Payer: BC Managed Care – PPO | Admitting: Family Medicine

## 2018-10-06 ENCOUNTER — Telehealth: Payer: Self-pay

## 2018-10-06 DIAGNOSIS — R432 Parageusia: Secondary | ICD-10-CM | POA: Diagnosis not present

## 2018-10-06 DIAGNOSIS — R430 Anosmia: Secondary | ICD-10-CM

## 2018-10-06 DIAGNOSIS — J029 Acute pharyngitis, unspecified: Secondary | ICD-10-CM

## 2018-10-06 DIAGNOSIS — H60503 Unspecified acute noninfective otitis externa, bilateral: Secondary | ICD-10-CM | POA: Insufficient documentation

## 2018-10-06 DIAGNOSIS — Z20822 Contact with and (suspected) exposure to covid-19: Secondary | ICD-10-CM

## 2018-10-06 DIAGNOSIS — R6889 Other general symptoms and signs: Secondary | ICD-10-CM | POA: Diagnosis not present

## 2018-10-06 MED ORDER — CIPROFLOXACIN HCL 500 MG PO TABS: 500.0000 mg | ORAL_TABLET | Freq: Two times a day (BID) | ORAL | 0 refills | Status: AC

## 2018-10-06 MED ORDER — CIPRO HC 0.2-1 % OT SUSP: 3.0000 [drp] | Freq: Two times a day (BID) | OTIC | 0 refills | Status: DC

## 2018-10-06 MED ORDER — NEOMYCIN-POLYMYXIN-HC 3.5-10000-1 OT SOLN: 4.0000 [drp] | Freq: Four times a day (QID) | OTIC | 0 refills | Status: AC

## 2018-10-06 NOTE — Telephone Encounter (Signed)
Spoke with patient and addressed concern. My progress note from today reflects change.

## 2018-10-06 NOTE — Telephone Encounter (Signed)
Patient calls nurse line stating the antibiotic drops are not covered by insurance. Pharmacy stated oral Augmentin would covered. Please advise.

## 2018-10-06 NOTE — Assessment & Plan Note (Signed)
Symptoms are concerning for COVID-19. Believe testing is warranted.   Advised of safety precautions in having the family member with COVID to isolate from other family, frequent hand washing, disinfecting the bathroom between use, and the high-touch areas of the home, and to have all family members quarantine for 10 days.   [id number] for COVID testing placed.  Patient advised of 3 locations for drive up testing including:  [place] [place] ([facility] in [place]) [address]. [place], [place] [address]  [place] ([place]) [address] [place], [place] [address]  [address] (near [place] in [place]) [place], [place] [address]  Patient advised of hours 8am-4pm and that they should be in line by 3 PM.  -ED precautions discussed and patient expressed good understanding -Patient counseled on wearing a mask, washing hands -Patient instructed to avoid others until she meets criteria for ending isolation after any suspected COVID, which are:  -3 days with no fever and  -Respiratory symptoms have improved (e.g. cough, shortness of breath) or  -10 days since symptoms first appeared

## 2018-10-06 NOTE — Assessment & Plan Note (Signed)
Patient presenting with two day history of bilateral otalgia (R>L) with intermittent drainage. Pain with movement of pinna during exam. Expect otitis externa based on these findings. Reassurance provided. Topical therapy is first line, however patient repeatedly requests oral antibiotics. Cipro HC not covered by insurance. Will call in alternative ear drop at this time. Will call in a course of oral Cipro but recommended patient hold off on picking up at this time. If no improvement in ear pain after 3-4 days of topical antibiotics, then she can start oral antibiotic at prescribed. Patient agreed to this plan.  - start Cortisporin 4 drops in each ear QID x 7 days - Ciprofloxacin 500mg  BID x 7 days if no improvement with topical antibiotics - return precautions discussed

## 2018-10-06 NOTE — Progress Notes (Signed)
[facility]  Patient consented to have virtual visit. Method of visit: Video  Encounter participants: Patient: Ashlee Herman - located at home Provider: Joana ReamerKiersten P  - [facility] Others (if applicable): None  Chief Complaint: Loss of sense of smell/taste, sore throat  HPI: Patient notes loss of sense of smell and taste, throat pain when swallowing, and bilateral ear pain x 4 days. Also endorses cough and fatigue. She notes she checked her temp and it was 99.0. Patient [place], got back yesterday. Two relatives were recently sick and she was with them over the past week [place]. One of them got pneumonia. Neither have been tested for COVID.  Has taken Sudafed and allergy pill without much improvement. She does have a history of recurrent ear infections requiring ear tubes that were removed when she was a child. She does note she was in and out of the water [place] and may have gotten water in her ears. She also endorses SOB with ambulation. She also has history of asthma and is using her albuterol inhaler q4-6 hours which is helping some.   Right ear pain > left ear pain x 2 days. Does endorse drainage from her right ear, notes drainage is yellow-ish. Pain has gotten worse.   ROS: per HPI  Pertinent PMHx: Asthma  Exam:  Gen: pleasant young lady, appears tired Respiratory: Speaking in full sentences without respiratory distress Right Ear; Pain with movement of the pinna   Assessment/Plan:  Acute sore throat Symptoms are concerning for COVID-19. Believe testing is warranted.   Advised of safety precautions in having the family member with COVID to isolate from other family, frequent hand washing, disinfecting the bathroom between use, and the high-touch areas of the home, and to have all family members quarantine for 10 days.   [id number] for COVID testing placed.  Patient advised  of 3 locations for drive up testing including:  [place] ([facility] [place] [address] [place], [place] [address]  [place] [address] [place] [address]  [address] ([place] in [place]) [place], [place] [address]  Patient advised of hours 8am-4pm and that they should be in line by 3 PM.  -ED precautions discussed and patient expressed good understanding -Patient counseled on wearing a mask, washing hands -Patient instructed to avoid others until she meets criteria for ending isolation after any suspected COVID, which are:  -3 days with no fever and  -Respiratory symptoms have improved (e.g. cough, shortness of breath) or  -10 days since symptoms first appeared   Acute otitis externa of both ears Patient presenting with two day history of bilateral otalgia (R>L) with intermittent drainage. Pain with movement of pinna during exam. Expect otitis externa based on these findings. Reassurance provided. Topical therapy is first line, however patient repeatedly requests oral antibiotics. Cipro HC not covered by insurance. Will call in alternative ear drop at this time. Will call in a course of oral Cipro but recommended patient hold off on picking up at this time. If no improvement in ear pain after 3-4 days of topical antibiotics, then she can start oral antibiotic at prescribed. Patient agreed to this plan.  - start Cortisporin 4 drops in each ear QID x 7 days - Ciprofloxacin 500mg  BID x 7 days if no improvement with topical antibiotics - return precautions discussed  Time spent during visit with patient: 30 minutes  Mina Marble, [place], PGY2 10/06/2018

## 2018-10-07 ENCOUNTER — Telehealth: Payer: Self-pay

## 2018-10-07 DIAGNOSIS — U071 COVID-19: Secondary | ICD-10-CM

## 2018-10-07 LAB — NOVEL CORONAVIRUS, NAA: SARS-CoV-2, NAA: DETECTED — AB

## 2018-10-07 MED ORDER — BENZONATATE 100 MG PO CAPS: 100.0000 mg | ORAL_CAPSULE | Freq: Three times a day (TID) | ORAL | 0 refills | Status: DC | PRN

## 2018-10-07 NOTE — Telephone Encounter (Signed)
A nurse for [employer] called nurse line to inform of positive covid result for patient. Please inform patient and fax a health department form.

## 2018-10-07 NOTE — Telephone Encounter (Signed)
Faxed letter to patient's job at [phone].    Ashlee Herman, [place]

## 2018-10-07 NOTE — Telephone Encounter (Signed)
Spoke to patient and informed her of her test results. Discussed CDC's quarantine recommendations and return precautions. Please contact health department to inform them of her test results.

## 2018-10-08 NOTE — Telephone Encounter (Signed)
Work did not received letter.  Please resend.  I verified the fax number is correct. Christen Bame, CMA

## 2018-10-08 NOTE — Telephone Encounter (Signed)
Refaxed letter. Front staff will let me know if it fails.  Ashlee Herman, [place]

## 2018-10-09 NOTE — Telephone Encounter (Signed)
Results not received again.   Will refax and mail copy to patient.   I also had her sign up for mychart so maybe she can send that to her boss. Christen Bame, CMA

## 2018-10-17 ENCOUNTER — Telehealth: Payer: Self-pay

## 2018-10-17 DIAGNOSIS — U071 COVID-19: Secondary | ICD-10-CM

## 2018-10-17 NOTE — Telephone Encounter (Signed)
Patient calls nurse line stating her job wants her to return back to work next week. Patient stated her job is wanting her to get another COVID test to make sure she is negative. Please advise.

## 2018-10-17 NOTE — Telephone Encounter (Signed)
Ordered repeat COVID for patient to return to work. Please let pt know test is ordered.

## 2018-10-17 NOTE — Telephone Encounter (Signed)
Spoke with pt. Informed her that repeat test was ordered. That she can go over whenever. Salvatore Marvel, CMA

## 2018-10-20 ENCOUNTER — Other Ambulatory Visit: Payer: Self-pay

## 2018-10-20 DIAGNOSIS — Z20822 Contact with and (suspected) exposure to covid-19: Secondary | ICD-10-CM

## 2018-10-22 LAB — NOVEL CORONAVIRUS, NAA: SARS-CoV-2, NAA: NOT DETECTED

## 2018-10-23 ENCOUNTER — Telehealth: Payer: Self-pay

## 2018-10-23 ENCOUNTER — Other Ambulatory Visit: Payer: Self-pay | Admitting: Family Medicine

## 2018-10-23 NOTE — Telephone Encounter (Signed)
Including Dr. Tarry Kos as she saw her on telemedicine originally. Christen Bame, CMA

## 2018-10-23 NOTE — Telephone Encounter (Signed)
Patient needs a note for work stating she is now able to return to work, starting 10/24/2018. Patient recently had a covid test and is negative. Please advise.

## 2018-10-23 NOTE — Telephone Encounter (Signed)
Faxed patients work note to her employer and left a copy up front, per her request.

## 2018-10-23 NOTE — Telephone Encounter (Signed)
Please let patient know there is a letter left at front office for her to pick up. Thank you!

## 2018-10-23 NOTE — Telephone Encounter (Signed)
Pt informed. Deseree Blount, CMA  

## 2019-01-26 ENCOUNTER — Other Ambulatory Visit: Payer: Self-pay

## 2019-01-26 DIAGNOSIS — Z20822 Contact with and (suspected) exposure to covid-19: Secondary | ICD-10-CM

## 2019-01-27 LAB — NOVEL CORONAVIRUS, NAA: SARS-CoV-2, NAA: NOT DETECTED

## 2019-02-09 ENCOUNTER — Ambulatory Visit: Payer: BC Managed Care – PPO | Admitting: Physician Assistant

## 2019-03-25 ENCOUNTER — Encounter (HOSPITAL_COMMUNITY): Payer: Self-pay | Admitting: Emergency Medicine

## 2019-03-25 ENCOUNTER — Other Ambulatory Visit: Payer: Self-pay

## 2019-03-25 ENCOUNTER — Ambulatory Visit (HOSPITAL_COMMUNITY)
Admission: EM | Admit: 2019-03-25 | Discharge: 2019-03-25 | Disposition: A | Discharge status: Home / Self Care | Payer: BC Managed Care – PPO | Attending: Emergency Medicine | Admitting: Emergency Medicine

## 2019-03-25 DIAGNOSIS — Z8249 Family history of ischemic heart disease and other diseases of the circulatory system: Secondary | ICD-10-CM | POA: Insufficient documentation

## 2019-03-25 DIAGNOSIS — N898 Other specified noninflammatory disorders of vagina: Secondary | ICD-10-CM | POA: Diagnosis not present

## 2019-03-25 DIAGNOSIS — Z79890 Hormone replacement therapy (postmenopausal): Secondary | ICD-10-CM | POA: Diagnosis not present

## 2019-03-25 DIAGNOSIS — R1031 Right lower quadrant pain: Secondary | ICD-10-CM | POA: Insufficient documentation

## 2019-03-25 DIAGNOSIS — Z79899 Other long term (current) drug therapy: Secondary | ICD-10-CM | POA: Insufficient documentation

## 2019-03-25 DIAGNOSIS — I10 Essential (primary) hypertension: Secondary | ICD-10-CM | POA: Diagnosis not present

## 2019-03-25 DIAGNOSIS — E119 Type 2 diabetes mellitus without complications: Secondary | ICD-10-CM | POA: Diagnosis not present

## 2019-03-25 DIAGNOSIS — J45909 Unspecified asthma, uncomplicated: Secondary | ICD-10-CM | POA: Insufficient documentation

## 2019-03-25 DIAGNOSIS — Z8349 Family history of other endocrine, nutritional and metabolic diseases: Secondary | ICD-10-CM | POA: Diagnosis not present

## 2019-03-25 DIAGNOSIS — Z3202 Encounter for pregnancy test, result negative: Secondary | ICD-10-CM | POA: Diagnosis not present

## 2019-03-25 DIAGNOSIS — Z888 Allergy status to other drugs, medicaments and biological substances status: Secondary | ICD-10-CM | POA: Diagnosis not present

## 2019-03-25 DIAGNOSIS — E063 Autoimmune thyroiditis: Secondary | ICD-10-CM | POA: Diagnosis not present

## 2019-03-25 LAB — POCT URINALYSIS DIP (DEVICE)
Bilirubin Urine: NEGATIVE
Glucose, UA: NEGATIVE mg/dL
Hgb urine dipstick: NEGATIVE
Ketones, ur: NEGATIVE mg/dL
Leukocytes,Ua: NEGATIVE
Nitrite: NEGATIVE
Protein, ur: NEGATIVE mg/dL
Specific Gravity, Urine: >=1.03 (ref 1.005–1.030)
Urobilinogen, UA: 0.2 mg/dL (ref 0.0–1.0)
pH: 6.5 (ref 5.0–8.0)

## 2019-03-25 LAB — POC URINE PREG, ED
Preg Test, Ur: NEGATIVE
Preg Test, Ur: NEGATIVE

## 2019-03-25 LAB — POCT PREGNANCY, URINE: Preg Test, Ur: NEGATIVE

## 2019-03-25 LAB — HIV ANTIBODY (ROUTINE TESTING W REFLEX): HIV Screen 4th Generation wRfx: NONREACTIVE

## 2019-03-25 MED ORDER — METRONIDAZOLE 500 MG PO TABS: 500.0000 mg | ORAL_TABLET | Freq: Two times a day (BID) | ORAL | 0 refills | Status: AC

## 2019-03-25 MED ORDER — LIDOCAINE HCL (PF) 1 % IJ SOLN
INTRAMUSCULAR | Status: AC
  Filled 2019-03-25: qty 2

## 2019-03-25 MED ORDER — DOXYCYCLINE HYCLATE 100 MG PO CAPS: 100.0000 mg | ORAL_CAPSULE | Freq: Two times a day (BID) | ORAL | 0 refills | Status: AC

## 2019-03-25 MED ORDER — CEFTRIAXONE SODIUM 500 MG IJ SOLR
500.0000 mg | Freq: Once | INTRAMUSCULAR | Status: AC
  Administered 2019-03-25: 500 mg via INTRAMUSCULAR

## 2019-03-25 MED ORDER — IBUPROFEN 600 MG PO TABS: 600.0000 mg | ORAL_TABLET | Freq: Four times a day (QID) | ORAL | 0 refills | Status: DC | PRN

## 2019-03-25 MED ORDER — CEFTRIAXONE SODIUM 500 MG IJ SOLR
INTRAMUSCULAR | Status: AC
  Filled 2019-03-25: qty 500

## 2019-03-25 NOTE — ED Provider Notes (Signed)
HPI  SUBJECTIVE:  Ashlee Herman is a 29 y.o. female who presents with 3 days of right lower quadrant pain described as intermittent, sore, sharp, lasting minutes.  It does not migrate or radiate.  She reports thin white clear nonodorous vaginal discharge.  She states her symptoms started after having intercourse last week with her female partner who is asymptomatic.  They are in a long-term monogamous relationship, however STDs are a concern today.  She denies nausea, vomiting, fevers.  She states that her back is "achy".  No abdominal distention.  Had a normal bowel movement this afternoon.  No anorexia, dyspareunia.  No urinary complaints.  No genital itching, rash, abnormal vaginal bleeding.  She tried a boric acid suppository, miconazole OTC and Tylenol 1000 mg x 1.  No alleviating factors.  Symptoms are worse with sitting.  The pain is not associated with eating, urination, defecation, standing up, sitting up straight.  States that it is worse with walking around.  States that the car ride over here was not painful.  She has had symptoms like this before was found to have a UTI.  She has a past medical history of endometriosis, hypertension, hypothyroidism, diabetes, UTI, yeast infections.  No history of gonorrhea, chlamydia, HIV, HSV, trichomonas, BV, pyelonephritis, nephrolithiasis, PID, appendicitis, TOA, ovarian cyst, ovarian torsion, PCOS.  No abdominal surgeries.  LMP: 04/2018 is on continuous OCPs.  PMD: [facility],     Past Medical History:  Diagnosis Date  . Asthma 11/2009  . Diabetes mellitus   . Dyspepsia   . Fainting episodes   . Goiter   . H/O dysmenorrhea 11/2009  . H/O: menorrhagia 11/2009  . Hypothyroid   . Obesity   . Thyroid activity decreased   . Thyroiditis, autoimmune     Past Surgical History:  Procedure Laterality Date  . CLEFT PALATE REPAIR    . TYMPANOSTOMY TUBE PLACEMENT      Family History  Problem Relation Age of Onset  . Obesity Mother   .  Hypertension Mother   . Obesity Father   . Cancer Maternal Grandmother   . Thyroid disease Neg Hx     Social History   Tobacco Use  . Smoking status: Never Smoker  . Smokeless tobacco: Never Used  Substance Use Topics  . Alcohol use: Yes    Comment: rare  . Drug use: No    No current facility-administered medications for this encounter.  Current Outpatient Medications:  .  Elagolix Sodium (ORILISSA) 150 MG TABS, Orilissa 150 mg tablet  Take 1 tablet every day by oral route as directed., Disp: , Rfl:  .  levothyroxine (SYNTHROID) 25 MCG tablet, Take 1 tablet (25 mcg total) by mouth daily before breakfast., Disp: 90 tablet, Rfl: 3 .  norethindrone (CAMILA) 0.35 MG tablet, Camila 0.35 mg tablet, Disp: 1 Package, Rfl: 11 .  albuterol (PROVENTIL) (2.5 MG/3ML) 0.083% nebulizer solution, Take 3 mLs (2.5 mg total) by nebulization every 6 (six) hours as needed for wheezing., Disp: 30 vial, Rfl: 0 .  doxycycline (VIBRAMYCIN) 100 MG capsule, Take 1 capsule (100 mg total) by mouth 2 (two) times daily for 7 days., Disp: 14 capsule, Rfl: 0 .  EPINEPHrine 0.3 mg/0.3 mL IJ SOAJ injection, Inject 0.3 mLs (0.3 mg total) into the muscle as needed for anaphylaxis., Disp: 1 each, Rfl: 0 .  ibuprofen (ADVIL) 600 MG tablet, Take 1 tablet (600 mg total) by mouth every 6 (six) hours as needed., Disp: 30 tablet, Rfl: 0 .  metroNIDAZOLE (FLAGYL) 500 MG tablet, Take 1 tablet (500 mg total) by mouth 2 (two) times daily for 7 days., Disp: 14 tablet, Rfl: 0 .  PROAIR HFA 108 (90 Base) MCG/ACT inhaler, INHALE 1-2 PUFFS INTO THE LUNGS EVERY 6 (SIX) HOURS AS NEEDED FOR WHEEZING OR SHORTNESS OF BREATH., Disp: 8.5 Inhaler, Rfl: 2 .  triamcinolone ointment (KENALOG) 0.5 %, Apply 1 application topically 2 (two) times daily., Disp: 90 g, Rfl: 3  Allergies  Allergen Reactions  . Tramadol      ROS  As noted in HPI.   Physical Exam  BP 128/80 (BP Location: Right Arm) Comment (BP Location): large cuff  Pulse 85    Temp 98.1 F (36.7 C) (Oral)   Resp 20   SpO2 100%   Constitutional: Well developed, well nourished, no acute distress Eyes: PERRL, EOMI, conjunctiva normal bilaterally HENT: Normocephalic, atraumatic,mucus membranes moist Respiratory: Normal inspiratory effort Cardiovascular: Normal rate GI: Soft, nondistended, normal bowel sounds, positive right lower quadrant tenderness, negative Rovsing, no rebound, no guarding tap table test negative. GU: Normal external genitalia.  Positive yellowish clearish vaginal discharge.  Normal os.  No CMT.  No uterine tenderness.  Positive right adnexal tenderness, no masses.  Chaperone present during exam. Back: no CVAT skin: No rash, skin intact Musculoskeletal: No deformity Neurologic: Alert & oriented x 3, CN III-XII grossly intact, no motor deficits, sensation grossly intact Psychiatric: Speech and behavior appropriate   ED Course   Medications  cefTRIAXone (ROCEPHIN) injection 500 mg (500 mg Intramuscular Given 03/25/19 1832)    Orders Placed This Encounter  Procedures  . RPR    Standing Status:   Standing    Number of Occurrences:   1  . HIV antibody    Standing Status:   Standing    Number of Occurrences:   1  . POC urine pregnancy    Standing Status:   Standing    Number of Occurrences:   1  . Pregnancy, urine POC    Standing Status:   Standing    Number of Occurrences:   1  . POC urine preg, ED (not at [facility])    Standing Status:   Standing    Number of Occurrences:   1  . POCT urinalysis dip (device)    Standing Status:   Standing    Number of Occurrences:   1  . POCT urinalysis dip (device)    Standing Status:   Standing    Number of Occurrences:   1   Results for orders placed or performed during the hospital encounter of 03/25/19 (from the past 24 hour(s))  POC urine pregnancy     Status: None   Collection Time: 03/25/19  5:54 PM  Result Value Ref Range   Preg Test, Ur NEGATIVE NEGATIVE  Pregnancy, urine POC     Status:  None   Collection Time: 03/25/19  5:54 PM  Result Value Ref Range   Preg Test, Ur NEGATIVE NEGATIVE  POC urine preg, ED (not at [facility])     Status: None   Collection Time: 03/25/19  5:54 PM  Result Value Ref Range   Preg Test, Ur NEGATIVE NEGATIVE  POCT urinalysis dip (device)     Status: Abnormal   Collection Time: 03/25/19  6:00 PM  Result Value Ref Range   Glucose, UA NEGATIVE NEGATIVE mg/dL   Bilirubin Urine SMALL (A) NEGATIVE   Ketones, ur NEGATIVE NEGATIVE mg/dL   Specific Gravity, Urine 1.025 1.005 - 1.030  Hgb urine dipstick LARGE (A) NEGATIVE   pH 6.5 5.0 - 8.0   Protein, ur 100 (A) NEGATIVE mg/dL   Urobilinogen, UA 1.0 0.0 - 1.0 mg/dL   Nitrite NEGATIVE NEGATIVE   Leukocytes,Ua TRACE (A) NEGATIVE  POCT urinalysis dip (device)     Status: None   Collection Time: 03/25/19  6:10 PM  Result Value Ref Range   Glucose, UA NEGATIVE NEGATIVE mg/dL   Bilirubin Urine NEGATIVE NEGATIVE   Ketones, ur NEGATIVE NEGATIVE mg/dL   Specific Gravity, Urine >=1.030 1.005 - 1.030   Hgb urine dipstick NEGATIVE NEGATIVE   pH 6.5 5.0 - 8.0   Protein, ur NEGATIVE NEGATIVE mg/dL   Urobilinogen, UA 0.2 0.0 - 1.0 mg/dL   Nitrite NEGATIVE NEGATIVE   Leukocytes,Ua NEGATIVE NEGATIVE   No results found.  ED Clinical Impression  1. Right lower quadrant abdominal pain      ED Assessment/Plan  Checking UA urine pregnancy, gonorrhea, chlamydia, trichomonas, BV, yeast, HIV, RPR per patient request.  Patient would like to be treated for gonorrhea and chlamydia empirically.  We will give her 500 mg of Rocephin and send home with doxycycline 100 mg twice daily for 7 days  Patient's urine is normal, concentrated.  She is not pregnant.  The abnormal urine result was in error.  Suspect ovarian cyst.  Lower in the differential is PID, no cervical motion tenderness.  Appendicitis also in the differential however, her tenderness seems to be more in the right adnexal area rather than the right lower  quadrant tenderness however discussed with patient that is very difficult to tell the 2 apart, and offered to send her to the ED for comprehensive evaluation today.  She appears nontoxic, also normal, she declined to go today, she is willing to try ibuprofen 600 mg combined with 1 g of Tylenol 3 or 4 times a day will send home with Flagyl for BV.  Follow-up with her OB/GYN ASAP, gave her strict ER return precautions.  Discussed labs,  MDM, treatment plan, and plan for follow-up with patient Discussed sn/sx that should prompt return to the ED. patient agrees with plan.   Meds ordered this encounter  Medications  . cefTRIAXone (ROCEPHIN) injection 500 mg  . doxycycline (VIBRAMYCIN) 100 MG capsule    Sig: Take 1 capsule (100 mg total) by mouth 2 (two) times daily for 7 days.    Dispense:  14 capsule    Refill:  0  . ibuprofen (ADVIL) 600 MG tablet    Sig: Take 1 tablet (600 mg total) by mouth every 6 (six) hours as needed.    Dispense:  30 tablet    Refill:  0  . metroNIDAZOLE (FLAGYL) 500 MG tablet    Sig: Take 1 tablet (500 mg total) by mouth 2 (two) times daily for 7 days.    Dispense:  14 tablet    Refill:  0    *This clinic note was created using Scientist, clinical (histocompatibility and immunogenetics). Therefore, there may be occasional mistakes despite careful proofreading.  ?   Domenick Gong, MD 03/25/19 1850

## 2019-03-25 NOTE — Discharge Instructions (Addendum)
Your urine is normal.  There is no UTI.  You are not pregnant.  We have treated you empirically for gonorrhea today.  I am sending you home with doxycycline which will cover chlamydia and Flagyl which will cover bacterial vaginosis.  Continue these unless your lab results come back negative.  Then you can stop them.  In the meantime, 600 mg of ibuprofen combined with 1 g of Tylenol together 3-4 times a day as needed for pain.  I suspect that this is an ovarian cyst, however it could be appendicitis.  Immediately to the ER for fevers above 100.4, pain not controlled with Tylenol/ibuprofen, if you lose your appetite, factors to move, or for any other concerns.  Otherwise follow-up with your OB/GYN as soon as you can.

## 2019-03-25 NOTE — ED Triage Notes (Signed)
Abdominal pain started 3 days ago, reports vaginal discharge.  Uncomfortable with urination.    Wants to r/o std/sti or is this endometriosis.

## 2019-03-26 LAB — POCT URINALYSIS DIP (DEVICE)
Glucose, UA: NEGATIVE mg/dL
Ketones, ur: NEGATIVE mg/dL
Nitrite: NEGATIVE
Protein, ur: 100 mg/dL — AB
Specific Gravity, Urine: 1.025 (ref 1.005–1.030)
Urobilinogen, UA: 1 mg/dL (ref 0.0–1.0)
pH: 6.5 (ref 5.0–8.0)

## 2019-03-26 LAB — RPR: RPR Ser Ql: NONREACTIVE

## 2019-03-27 LAB — CERVICOVAGINAL ANCILLARY ONLY
Bacterial vaginitis: NEGATIVE
Candida vaginitis: NEGATIVE
Chlamydia: NEGATIVE
Neisseria Gonorrhea: NEGATIVE
Trichomonas: NEGATIVE

## 2019-03-31 ENCOUNTER — Other Ambulatory Visit: Payer: Self-pay | Admitting: Obstetrics & Gynecology

## 2019-03-31 DIAGNOSIS — R102 Pelvic and perineal pain: Secondary | ICD-10-CM | POA: Diagnosis not present

## 2019-03-31 DIAGNOSIS — Z01411 Encounter for gynecological examination (general) (routine) with abnormal findings: Secondary | ICD-10-CM | POA: Diagnosis not present

## 2019-03-31 DIAGNOSIS — R1031 Right lower quadrant pain: Secondary | ICD-10-CM | POA: Diagnosis not present

## 2019-03-31 DIAGNOSIS — N809 Endometriosis, unspecified: Secondary | ICD-10-CM | POA: Diagnosis not present

## 2019-03-31 DIAGNOSIS — Z124 Encounter for screening for malignant neoplasm of cervix: Secondary | ICD-10-CM | POA: Diagnosis not present

## 2019-04-13 ENCOUNTER — Ambulatory Visit
Admission: RE | Admit: 2019-04-13 | Discharge: 2019-04-13 | Disposition: A | Discharge status: Home / Self Care | Payer: BC Managed Care – PPO | Source: Ambulatory Visit | Attending: Obstetrics & Gynecology | Admitting: Obstetrics & Gynecology

## 2019-04-13 DIAGNOSIS — R102 Pelvic and perineal pain: Secondary | ICD-10-CM | POA: Diagnosis not present

## 2019-04-14 DIAGNOSIS — R102 Pelvic and perineal pain: Secondary | ICD-10-CM | POA: Diagnosis not present

## 2019-05-05 ENCOUNTER — Other Ambulatory Visit: Payer: Self-pay | Admitting: Obstetrics & Gynecology

## 2019-05-05 DIAGNOSIS — R87612 Low grade squamous intraepithelial lesion on cytologic smear of cervix (LGSIL): Secondary | ICD-10-CM | POA: Diagnosis not present

## 2019-05-05 DIAGNOSIS — N72 Inflammatory disease of cervix uteri: Secondary | ICD-10-CM | POA: Diagnosis not present

## 2019-05-12 DIAGNOSIS — Z113 Encounter for screening for infections with a predominantly sexual mode of transmission: Secondary | ICD-10-CM | POA: Diagnosis not present

## 2019-10-10 ENCOUNTER — Emergency Department (HOSPITAL_COMMUNITY): Payer: No Typology Code available for payment source

## 2019-10-10 ENCOUNTER — Encounter (HOSPITAL_COMMUNITY): Payer: Self-pay

## 2019-10-10 ENCOUNTER — Emergency Department (HOSPITAL_COMMUNITY)
Admission: EM | Admit: 2019-10-10 | Discharge: 2019-10-10 | Disposition: A | Discharge status: Home / Self Care | Payer: No Typology Code available for payment source | Attending: Emergency Medicine | Admitting: Emergency Medicine

## 2019-10-10 ENCOUNTER — Other Ambulatory Visit: Payer: Self-pay

## 2019-10-10 DIAGNOSIS — H9201 Otalgia, right ear: Secondary | ICD-10-CM | POA: Diagnosis not present

## 2019-10-10 DIAGNOSIS — R05 Cough: Secondary | ICD-10-CM | POA: Diagnosis not present

## 2019-10-10 DIAGNOSIS — R000 Tachycardia, unspecified: Secondary | ICD-10-CM | POA: Diagnosis not present

## 2019-10-10 DIAGNOSIS — J3489 Other specified disorders of nose and nasal sinuses: Secondary | ICD-10-CM | POA: Insufficient documentation

## 2019-10-10 DIAGNOSIS — I10 Essential (primary) hypertension: Secondary | ICD-10-CM | POA: Diagnosis not present

## 2019-10-10 DIAGNOSIS — Z5321 Procedure and treatment not carried out due to patient leaving prior to being seen by health care provider: Secondary | ICD-10-CM | POA: Diagnosis not present

## 2019-10-10 DIAGNOSIS — R079 Chest pain, unspecified: Secondary | ICD-10-CM | POA: Diagnosis not present

## 2019-10-10 DIAGNOSIS — Z20822 Contact with and (suspected) exposure to covid-19: Secondary | ICD-10-CM | POA: Insufficient documentation

## 2019-10-10 DIAGNOSIS — R070 Pain in throat: Secondary | ICD-10-CM | POA: Diagnosis not present

## 2019-10-10 DIAGNOSIS — R52 Pain, unspecified: Secondary | ICD-10-CM | POA: Diagnosis not present

## 2019-10-10 LAB — CBC WITH DIFFERENTIAL/PLATELET
Abs Immature Granulocytes: 0.02 10*3/uL (ref 0.00–0.07)
Basophils Absolute: 0 10*3/uL (ref 0.0–0.1)
Basophils Relative: 0 %
Eosinophils Absolute: 0 10*3/uL (ref 0.0–0.5)
Eosinophils Relative: 1 %
HCT: 42.3 % (ref 36.0–46.0)
Hemoglobin: 14.1 g/dL (ref 12.0–15.0)
Immature Granulocytes: 0 %
Lymphocytes Relative: 18 %
Lymphs Abs: 1.4 10*3/uL (ref 0.7–4.0)
MCH: 30.4 pg (ref 26.0–34.0)
MCHC: 33.3 g/dL (ref 30.0–36.0)
MCV: 91.2 fL (ref 80.0–100.0)
Monocytes Absolute: 0.7 10*3/uL (ref 0.1–1.0)
Monocytes Relative: 8 %
Neutro Abs: 5.7 10*3/uL (ref 1.7–7.7)
Neutrophils Relative %: 73 %
Platelets: 342 10*3/uL (ref 150–400)
RBC: 4.64 MIL/uL (ref 3.87–5.11)
RDW: 12.6 % (ref 11.5–15.5)
WBC: 7.8 10*3/uL (ref 4.0–10.5)
nRBC: 0 % (ref 0.0–0.2)

## 2019-10-10 LAB — COMPREHENSIVE METABOLIC PANEL
ALT: 34 U/L (ref 0–44)
AST: 44 U/L — ABNORMAL HIGH (ref 15–41)
Albumin: 3.9 g/dL (ref 3.5–5.0)
Alkaline Phosphatase: 28 U/L — ABNORMAL LOW (ref 38–126)
Anion gap: 12 (ref 5–15)
BUN: 9 mg/dL (ref 6–20)
CO2: 24 mmol/L (ref 22–32)
Calcium: 9.7 mg/dL (ref 8.9–10.3)
Chloride: 100 mmol/L (ref 98–111)
Creatinine, Ser: 0.85 mg/dL (ref 0.44–1.00)
GFR calc Af Amer: >60 mL/min (ref >60)
GFR calc non Af Amer: >60 mL/min (ref >60)
Glucose, Bld: 99 mg/dL (ref 70–99)
Potassium: 4 mmol/L (ref 3.5–5.1)
Sodium: 136 mmol/L (ref 135–145)
Total Bilirubin: 0.3 mg/dL (ref 0.3–1.2)
Total Protein: 8.7 g/dL — ABNORMAL HIGH (ref 6.5–8.1)

## 2019-10-10 LAB — SARS CORONAVIRUS 2 BY RT PCR (HOSPITAL ORDER, PERFORMED IN ~~LOC~~ HOSPITAL LAB): SARS Coronavirus 2: NEGATIVE

## 2019-10-10 NOTE — ED Triage Notes (Signed)
Pt reports her last clinic visit she was negative for strep. Pt c/o Right ear pain and has yellow/white pockets on the back of her throat. PCP concerned for mono

## 2019-10-10 NOTE — ED Notes (Signed)
Pt stated that she was going to leave. Pt has left building.

## 2019-10-10 NOTE — ED Triage Notes (Signed)
Pt BIB [facility] c/o ear ache, sinus pressure, recent strep test given amoxicillin, went to another clinic dx w/sinus infection and given Augmentin. Pt c/o ongoing tonsil pain, cough, fever, chills, vomiting, dizziness, flank pain.     BP 136/90 HR 120 RR 18 96% RA  CBG 107 98.0

## 2019-10-11 ENCOUNTER — Ambulatory Visit (HOSPITAL_COMMUNITY)
Admission: EM | Admit: 2019-10-11 | Discharge: 2019-10-11 | Discharge status: Against Medical Advice | Payer: BC Managed Care – PPO

## 2019-10-12 ENCOUNTER — Other Ambulatory Visit: Payer: Self-pay

## 2019-10-12 ENCOUNTER — Encounter (HOSPITAL_COMMUNITY): Payer: Self-pay

## 2019-10-12 ENCOUNTER — Ambulatory Visit (HOSPITAL_COMMUNITY)
Admission: EM | Admit: 2019-10-12 | Discharge: 2019-10-12 | Disposition: A | Discharge status: Home / Self Care | Payer: BC Managed Care – PPO

## 2019-10-12 DIAGNOSIS — J208 Acute bronchitis due to other specified organisms: Secondary | ICD-10-CM

## 2019-10-12 DIAGNOSIS — R053 Chronic cough: Secondary | ICD-10-CM

## 2019-10-12 DIAGNOSIS — B9689 Other specified bacterial agents as the cause of diseases classified elsewhere: Secondary | ICD-10-CM

## 2019-10-12 MED ORDER — DEXAMETHASONE SODIUM PHOSPHATE 10 MG/ML IJ SOLN
INTRAMUSCULAR | Status: AC
  Filled 2019-10-12: qty 1

## 2019-10-12 MED ORDER — PREDNISONE 20 MG PO TABS: 40.0000 mg | ORAL_TABLET | Freq: Every day | ORAL | 0 refills | Status: AC

## 2019-10-12 MED ORDER — PROMETHAZINE-CODEINE 6.25-10 MG/5ML PO SYRP: 5.0000 mL | ORAL_SOLUTION | Freq: Four times a day (QID) | ORAL | 0 refills | Status: DC | PRN

## 2019-10-12 MED ORDER — BENZONATATE 100 MG PO CAPS: 100.0000 mg | ORAL_CAPSULE | Freq: Three times a day (TID) | ORAL | 0 refills | Status: DC | PRN

## 2019-10-12 MED ORDER — AMOXICILLIN-POT CLAVULANATE 875-125 MG PO TABS: 1.0000 | ORAL_TABLET | Freq: Two times a day (BID) | ORAL | 0 refills | Status: AC

## 2019-10-12 MED ORDER — DEXAMETHASONE SODIUM PHOSPHATE 10 MG/ML IJ SOLN
10.0000 mg | Freq: Once | INTRAMUSCULAR | Status: AC
  Administered 2019-10-12: 10 mg via INTRAMUSCULAR

## 2019-10-12 NOTE — Discharge Instructions (Addendum)
If your symptoms have not improved significantly by Friday, return here or at primary care office for evaluation. As of today, I do not think you need a repeat COVID-19 test since you've had 3 negative test.  However, if you continue with all medications prescribed and symptoms are significantly better, consider a repeat COVID-19 test.

## 2019-10-12 NOTE — ED Triage Notes (Signed)
Pt c/o productive cough w/yellow mucous, sore throat. Pt denies dysphagia. Pt states she was tested for COVID 8/1 and 8/7 and they were both neg. Pt was tested for strept on 8/5 and it was neg.

## 2019-10-12 NOTE — ED Provider Notes (Addendum)
[facility]    CSN: [id number] Arrival date & time: 10/12/19  1222      History   Chief Complaint Chief Complaint  Patient presents with  . Sore Throat    HPI Ashlee Herman is a 29 y.o. female.   HPI Patient presents for evaluation of sore throat and productive cough.  Patient has a medical history significant for asthma. She was seen at [facility] on 8/5 , treated with Augmentin which she is still taking. She also had a negative COVID-19 test 8/1, 8/7, and negative strep 8/5. She is afebrile. Endorses a persistent cough, wheezing, and shortness of breath. She has a history of asthma but reports well controlled. She was treated for acute sinusitis and otitis media at [facility] and both issues have resolved. Throat pain and adenopathy have improved, she continues to have exudate in the back of throat. She has completed almost 4 days of antibiotics, however feels significantly worse.    Past Medical History:  Diagnosis Date  . Asthma 11/2009  . Diabetes mellitus   . Dyspepsia   . Fainting episodes   . Goiter   . H/O dysmenorrhea 11/2009  . H/O: menorrhagia 11/2009  . Hypothyroid   . Obesity   . Thyroid activity decreased   . Thyroiditis, autoimmune     Patient Active Problem List   Diagnosis Date Noted  . Acute otitis externa of both ears 10/06/2018  . Acute sore throat 10/06/2018  . Endometriosis 09/16/2018  . Atopic dermatitis 09/11/2018  . Generalized anxiety disorder 02/06/2017  . Mild persistent asthma without complication 03/19/2016  . Prediabetes   . Obesity   . Hypertension   . Dyspepsia   . Hypothyroid   . Pre-diabetes 06/12/2010    Past Surgical History:  Procedure Laterality Date  . CLEFT PALATE REPAIR    . TYMPANOSTOMY TUBE PLACEMENT      OB History   No obstetric history on file.      Home Medications    Prior to Admission medications   Medication Sig Start Date End Date Taking? Authorizing Provider  levocetirizine (XYZAL) 5  MG tablet Take 5 mg by mouth every evening.   Yes [provider]  Multiple Vitamin (MULTIVITAMIN) tablet Take 1 tablet by mouth daily.   Yes [provider]  albuterol (PROVENTIL) (2.5 MG/3ML) 0.083% nebulizer solution Take 3 mLs (2.5 mg total) by nebulization every 6 (six) hours as needed for wheezing. 12/25/11   Benjiman Core, MD  amoxicillin-clavulanate (AUGMENTIN) 875-125 MG tablet Take 1 tablet by mouth every 12 (twelve) hours for 5 days. 10/12/19 10/17/19  Bing Neighbors, FNP  benzonatate (TESSALON) 100 MG capsule Take 1-2 capsules (100-200 mg total) by mouth 3 (three) times daily as needed for cough. 10/12/19   Bing Neighbors, FNP  Elagolix Sodium (ORILISSA) 150 MG TABS Orilissa 150 mg tablet  Take 1 tablet every day by oral route as directed.    [provider]  EPINEPHrine 0.3 mg/0.3 mL IJ SOAJ injection Inject 0.3 mLs (0.3 mg total) into the muscle as needed for anaphylaxis. 09/08/18   Melene Plan, MD  ibuprofen (ADVIL) 600 MG tablet Take 1 tablet (600 mg total) by mouth every 6 (six) hours as needed. 03/25/19   Domenick Gong, MD  levothyroxine (SYNTHROID) 25 MCG tablet Take 1 tablet (25 mcg total) by mouth daily before breakfast. 09/16/18   Melene Plan, MD  norethindrone (CAMILA) 0.35 MG tablet Camila 0.35 mg tablet 09/16/18  Melene Plan, MD  predniSONE (DELTASONE) 20 MG tablet Take 2 tablets (40 mg total) by mouth daily with breakfast for 5 days. 10/13/19 10/18/19  Bing Neighbors, FNP  PROAIR HFA 108 [phone] Base) MCG/ACT inhaler INHALE 1-2 PUFFS INTO THE LUNGS EVERY 6 (SIX) HOURS AS NEEDED FOR WHEEZING OR SHORTNESS OF BREATH. 10/24/17   Tillman Sers, DO  promethazine-codeine (PHENERGAN WITH CODEINE) 6.25-10 MG/5ML syrup Take 5 mLs by mouth every 6 (six) hours as needed for cough. 10/12/19   Bing Neighbors, FNP  triamcinolone ointment (KENALOG) 0.5 % Apply 1 application topically 2 (two) times daily. 09/16/18   Melene Plan, MD    Family  History Family History  Problem Relation Age of Onset  . Obesity Mother   . Hypertension Mother   . Obesity Father   . Cancer Maternal Grandmother   . Thyroid disease Neg Hx     Social History Social History   Tobacco Use  . Smoking status: Never Smoker  . Smokeless tobacco: Never Used  Vaping Use  . Vaping Use: Never used  Substance Use Topics  . Alcohol use: Yes    Comment: rare  . Drug use: No     Allergies   Tramadol   Review of Systems Review of Systems Pertinent negatives listed in HPI Physical Exam Triage Vital Signs ED Triage Vitals  Enc Vitals Group     BP 10/12/19 1514 140/84     Pulse Rate 10/12/19 1514 (!) 111     Resp 10/12/19 1514 16     Temp 10/12/19 1514 98.5 F (36.9 C)     Temp Source 10/12/19 1514 Oral     SpO2 10/12/19 1514 98 %     Weight 10/12/19 1515 260 lb (117.9 kg)     Height 10/12/19 1515 5\' 6"  (1.676 m)     Head Circumference --      Peak Flow --      Pain Score 10/12/19 1514 10     Pain Loc --      Pain Edu? --      Excl. in GC? --    No data found.  Updated Vital Signs BP 140/84   Pulse (!) 111   Temp 98.5 F (36.9 C) (Oral)   Resp 16   Ht 5\' 6"  (1.676 m)   Wt 260 lb (117.9 kg)   SpO2 98%   BMI 41.97 kg/m   Visual Acuity Right Eye Distance:   Left Eye Distance:   Bilateral Distance:    Right Eye Near:   Left Eye Near:    Bilateral Near:     Physical Exam Constitutional:      Appearance: She is obese. She is ill-appearing.  HENT:     Mouth/Throat:     Mouth: Mucous membranes are moist.     Pharynx: Oropharyngeal exudate present. No pharyngeal swelling or uvula swelling.     Tonsils: Tonsillar exudate present.  Cardiovascular:     Rate and Rhythm: Tachycardia present.  Pulmonary:     Breath sounds: Wheezing and rhonchi present.  Skin:    General: Skin is warm and dry.     Capillary Refill: Capillary refill takes less than 2 seconds.    UC Treatments / Results  Labs (all labs ordered are listed,  but only abnormal results are displayed) Labs Reviewed - No data to display  EKG   Radiology No results found.  Initial Impression / Assessment and Plan / UC Course  I have reviewed the triage vital signs and the nursing notes.  Pertinent labs & imaging results that were available during my care of the patient were reviewed by me and considered in my medical decision making (see chart for details).    Decadron 10 mg IM administered here in clinic today. Extending antibiotics given symptoms have worsened. Adding prednisone PO start tomorrow, take as directed. Daytime and QHS antitussive medication prescribed. Encouraged repeat COVID testing if symptom worsen. ER is breathing worsens.  An After Visit Summary was printed and given to the patient/family. Precautions discussed.Red flags discussed.Questions invited and answered. They voiced understanding and agreement.   Final Clinical Impressions(s) / UC Diagnoses   Final diagnoses:  Acute bacterial bronchitis  Persistent cough   Discharge Instructions   If your symptoms have not improved significantly by Friday, return here or at primary care office for evaluation. As of today, I do not think you need a repeat COVID-19 test since you've had 3 negative test.  However, if you continue with all medications prescribed and symptoms are significantly better, consider a repeat COVID-19 test.    ED Prescriptions         Reactions          Medication List    TAKE these medications   amoxicillin-clavulanate 875-125 MG tablet Commonly known as: AUGMENTIN Take 1 tablet by mouth every 12 (twelve) hours for 5 days. What changed: when to take this   benzonatate 100 MG capsule Commonly known as: TESSALON Take 1-2 capsules (100-200 mg total) by mouth 3 (three) times daily as needed for cough.   predniSONE 20 MG tablet Commonly known as: DELTASONE Take 2 tablets (40 mg total) by mouth daily with breakfast for 5 days.     promethazine-codeine 6.25-10 MG/5ML syrup Commonly known as: PHENERGAN with CODEINE Take 5 mLs by mouth every 6 (six) hours as needed for cough.          PDMP not reviewed this encounter.    Bing Neighbors, FNP 10/16/19 1015

## 2019-10-13 LAB — PATHOLOGIST SMEAR REVIEW: Path Review: REACTIVE

## 2019-10-19 ENCOUNTER — Ambulatory Visit (HOSPITAL_COMMUNITY)
Admission: EM | Admit: 2019-10-19 | Discharge: 2019-10-19 | Disposition: A | Discharge status: Home / Self Care | Payer: No Typology Code available for payment source | Attending: Family Medicine | Admitting: Family Medicine

## 2019-10-19 ENCOUNTER — Other Ambulatory Visit: Payer: Self-pay

## 2019-10-19 ENCOUNTER — Encounter (HOSPITAL_COMMUNITY): Payer: Self-pay | Admitting: Emergency Medicine

## 2019-10-19 ENCOUNTER — Ambulatory Visit (INDEPENDENT_AMBULATORY_CARE_PROVIDER_SITE_OTHER): Payer: No Typology Code available for payment source

## 2019-10-19 DIAGNOSIS — Z79899 Other long term (current) drug therapy: Secondary | ICD-10-CM | POA: Diagnosis not present

## 2019-10-19 DIAGNOSIS — R05 Cough: Secondary | ICD-10-CM | POA: Insufficient documentation

## 2019-10-19 DIAGNOSIS — N809 Endometriosis, unspecified: Secondary | ICD-10-CM | POA: Diagnosis not present

## 2019-10-19 DIAGNOSIS — E063 Autoimmune thyroiditis: Secondary | ICD-10-CM | POA: Insufficient documentation

## 2019-10-19 DIAGNOSIS — R0600 Dyspnea, unspecified: Secondary | ICD-10-CM | POA: Diagnosis not present

## 2019-10-19 DIAGNOSIS — E669 Obesity, unspecified: Secondary | ICD-10-CM | POA: Diagnosis not present

## 2019-10-19 DIAGNOSIS — Z7952 Long term (current) use of systemic steroids: Secondary | ICD-10-CM | POA: Diagnosis not present

## 2019-10-19 DIAGNOSIS — I10 Essential (primary) hypertension: Secondary | ICD-10-CM | POA: Insufficient documentation

## 2019-10-19 DIAGNOSIS — Z20822 Contact with and (suspected) exposure to covid-19: Secondary | ICD-10-CM | POA: Insufficient documentation

## 2019-10-19 DIAGNOSIS — R062 Wheezing: Secondary | ICD-10-CM

## 2019-10-19 DIAGNOSIS — R197 Diarrhea, unspecified: Secondary | ICD-10-CM | POA: Insufficient documentation

## 2019-10-19 DIAGNOSIS — E119 Type 2 diabetes mellitus without complications: Secondary | ICD-10-CM | POA: Diagnosis not present

## 2019-10-19 DIAGNOSIS — J4530 Mild persistent asthma, uncomplicated: Secondary | ICD-10-CM | POA: Insufficient documentation

## 2019-10-19 DIAGNOSIS — F411 Generalized anxiety disorder: Secondary | ICD-10-CM | POA: Diagnosis not present

## 2019-10-19 DIAGNOSIS — Z791 Long term (current) use of non-steroidal anti-inflammatories (NSAID): Secondary | ICD-10-CM | POA: Insufficient documentation

## 2019-10-19 DIAGNOSIS — R059 Cough, unspecified: Secondary | ICD-10-CM

## 2019-10-19 DIAGNOSIS — Z8249 Family history of ischemic heart disease and other diseases of the circulatory system: Secondary | ICD-10-CM | POA: Diagnosis not present

## 2019-10-19 DIAGNOSIS — Z885 Allergy status to narcotic agent status: Secondary | ICD-10-CM | POA: Diagnosis not present

## 2019-10-19 LAB — CBC WITH DIFFERENTIAL/PLATELET
Abs Immature Granulocytes: 0.05 10*3/uL (ref 0.00–0.07)
Basophils Absolute: 0.1 10*3/uL (ref 0.0–0.1)
Basophils Relative: 0 %
Eosinophils Absolute: 0.1 10*3/uL (ref 0.0–0.5)
Eosinophils Relative: 1 %
HCT: 40.9 % (ref 36.0–46.0)
Hemoglobin: 13.6 g/dL (ref 12.0–15.0)
Immature Granulocytes: 0 %
Lymphocytes Relative: 15 %
Lymphs Abs: 2 10*3/uL (ref 0.7–4.0)
MCH: 30 pg (ref 26.0–34.0)
MCHC: 33.3 g/dL (ref 30.0–36.0)
MCV: 90.1 fL (ref 80.0–100.0)
Monocytes Absolute: 0.7 10*3/uL (ref 0.1–1.0)
Monocytes Relative: 5 %
Neutro Abs: 10.7 10*3/uL — ABNORMAL HIGH (ref 1.7–7.7)
Neutrophils Relative %: 79 %
Platelets: 476 10*3/uL — ABNORMAL HIGH (ref 150–400)
RBC: 4.54 MIL/uL (ref 3.87–5.11)
RDW: 12.3 % (ref 11.5–15.5)
WBC: 13.6 10*3/uL — ABNORMAL HIGH (ref 4.0–10.5)
nRBC: 0 % (ref 0.0–0.2)

## 2019-10-19 MED ORDER — ALBUTEROL SULFATE (2.5 MG/3ML) 0.083% IN NEBU: 2.5000 mg | INHALATION_SOLUTION | Freq: Four times a day (QID) | RESPIRATORY_TRACT | 0 refills | Status: DC | PRN

## 2019-10-19 MED ORDER — PREDNISONE 20 MG PO TABS
ORAL_TABLET | ORAL | 0 refills | Status: DC
Start: 2019-10-19 — End: 2020-07-17

## 2019-10-19 NOTE — ED Provider Notes (Signed)
[facility]    CSN: [id number] Arrival date & time: 10/19/19  1746      History   Chief Complaint Chief Complaint  Patient presents with   Cough    HPI Ashlee Herman is a 29 y.o. female.   Ashlee Herman presents with complaints of persistent cough. All started 8/1. 8/7 was seen here with shortness of breath , was diagnosed with bronchitis. Had been taking augmentin for ear infection, sinus infection which she received from an outlying clinic. She was instructed to continue with her nebulizer, inhaler, as well as tessalon. Was provided additional augmentin to complete. Her breathing has been worsening over the past 4 days. Difficult to sleep due to cough. Cough syrup hasn't helped. Waking out of sleep due to cough. Had to stop while working to catch breath. Vomited on way here tonight due to cough. Chest pain with cough. Back pain from cough. Anterior ribs are sensitive and tender. Completed 5 days of prednisone, this helped some with breathing but minimal improvement. She feels like talking is easier but cough is worsening. History of asthma. Doesn't smoke. Takes xyzal. No previous pneumonia history. Has had bronchitis when she was in college. Has had diarrhea. Has had two negative covid tests and has been vaccinated for covid-19. No known ill contacts. Still currently taking augmentin. A total of 15 days of augmentin has been provided.    ROS per HPI, negative if not otherwise mentioned.      Past Medical History:  Diagnosis Date   Asthma 11/2009   Diabetes mellitus    Dyspepsia    Fainting episodes    Goiter    H/O dysmenorrhea 11/2009   H/O: menorrhagia 11/2009   Hypothyroid    Obesity    Thyroid activity decreased    Thyroiditis, autoimmune     Patient Active Problem List   Diagnosis Date Noted   Acute otitis externa of both ears 10/06/2018   Acute sore throat 10/06/2018   Endometriosis 09/16/2018   Atopic dermatitis 09/11/2018    Generalized anxiety disorder 02/06/2017   Mild persistent asthma without complication 03/19/2016   Prediabetes    Obesity    Hypertension    Dyspepsia    Hypothyroid    Pre-diabetes 06/12/2010    Past Surgical History:  Procedure Laterality Date   CLEFT PALATE REPAIR     TYMPANOSTOMY TUBE PLACEMENT      OB History   No obstetric history on file.      Home Medications    Prior to Admission medications   Medication Sig Start Date End Date Taking? Authorizing Provider  benzonatate (TESSALON) 100 MG capsule Take 1-2 capsules (100-200 mg total) by mouth 3 (three) times daily as needed for cough. 10/12/19  Yes Bing Neighbors, FNP  Elagolix Sodium (ORILISSA) 150 MG TABS Orilissa 150 mg tablet  Take 1 tablet every day by oral route as directed.   Yes [provider]  EPINEPHrine 0.3 mg/0.3 mL IJ SOAJ injection Inject 0.3 mLs (0.3 mg total) into the muscle as needed for anaphylaxis. 09/08/18  Yes Melene Plan, MD  ibuprofen (ADVIL) 600 MG tablet Take 1 tablet (600 mg total) by mouth every 6 (six) hours as needed. 03/25/19  Yes Domenick Gong, MD  levocetirizine (XYZAL) 5 MG tablet Take 5 mg by mouth every evening.   Yes [provider]  levothyroxine (SYNTHROID) 25 MCG tablet Take 1 tablet (25 mcg total) by mouth daily before breakfast. 09/16/18  Yes Selena Batten,  Vinnie Langton, MD  Multiple Vitamin (MULTIVITAMIN) tablet Take 1 tablet by mouth daily.   Yes [provider]  norethindrone (CAMILA) 0.35 MG tablet Camila 0.35 mg tablet 09/16/18  Yes Melene Plan, MD  PROAIR HFA 108 [phone] Base) MCG/ACT inhaler INHALE 1-2 PUFFS INTO THE LUNGS EVERY 6 (SIX) HOURS AS NEEDED FOR WHEEZING OR SHORTNESS OF BREATH. 10/24/17  Yes Riccio, Angela C, DO  promethazine-codeine (PHENERGAN WITH CODEINE) 6.25-10 MG/5ML syrup Take 5 mLs by mouth every 6 (six) hours as needed for cough. 10/12/19  Yes Bing Neighbors, FNP  triamcinolone ointment (KENALOG) 0.5 % Apply 1 application topically  2 (two) times daily. 09/16/18  Yes Melene Plan, MD  albuterol (PROVENTIL) (2.5 MG/3ML) 0.083% nebulizer solution Take 3 mLs (2.5 mg total) by nebulization every 6 (six) hours as needed for wheezing. 10/19/19   Georgetta Haber, NP  predniSONE (DELTASONE) 20 MG tablet 3-3-3-2-2-2-1-1-1 10/19/19   Georgetta Haber, NP    Family History Family History  Problem Relation Age of Onset   Obesity Mother    Hypertension Mother    Obesity Father    Cancer Maternal Grandmother    Thyroid disease Neg Hx     Social History Social History   Tobacco Use   Smoking status: Never Smoker   Smokeless tobacco: Never Used  Building services engineer Use: Never used  Substance Use Topics   Alcohol use: Yes    Comment: rare   Drug use: No     Allergies   Tramadol   Review of Systems Review of Systems   Physical Exam Triage Vital Signs ED Triage Vitals  Enc Vitals Group     BP 10/19/19 1933 116/72     Pulse Rate 10/19/19 1933 (!) 103     Resp 10/19/19 1933 20     Temp 10/19/19 1933 98.5 F (36.9 C)     Temp src --      SpO2 10/19/19 1933 100 %     Weight --      Height --      Head Circumference --      Peak Flow --      Pain Score 10/19/19 1931 10     Pain Loc --      Pain Edu? --      Excl. in GC? --    No data found.  Updated Vital Signs BP 116/72 (BP Location: Right Arm)    Pulse (!) 103    Temp 98.5 F (36.9 C)    Resp 20    LMP 10/05/2019    SpO2 100%   Visual Acuity Right Eye Distance:   Left Eye Distance:   Bilateral Distance:    Right Eye Near:   Left Eye Near:    Bilateral Near:     Physical Exam Constitutional:      General: She is not in acute distress.    Appearance: She is well-developed.  Cardiovascular:     Rate and Rhythm: Normal rate.  Pulmonary:     Effort: Pulmonary effort is normal.     Breath sounds: Normal breath sounds. No wheezing or rhonchi.     Comments: Dry cough noted  Skin:    General: Skin is warm and dry.  Neurological:      Mental Status: She is alert and oriented to person, place, and time.      UC Treatments / Results  Labs (all labs ordered are listed, but only abnormal results  are displayed) Labs Reviewed  CBC WITH DIFFERENTIAL/PLATELET - Abnormal; Notable for the following components:      Result Value   WBC 13.6 (*)    Platelets 476 (*)    Neutro Abs 10.7 (*)    All other components within normal limits  SARS CORONAVIRUS 2 (TAT 6-24 HRS)    EKG   Radiology DG Chest 2 View  Result Date: 10/19/2019 CLINICAL DATA:  Chronic cough, wheezing and dyspnea EXAM: CHEST - 2 VIEW COMPARISON:  10/10/2019 chest radiograph. FINDINGS: Stable cardiomediastinal silhouette with normal heart size. No pneumothorax. No pleural effusion. Lungs appear clear, with no acute consolidative airspace disease and no pulmonary edema. IMPRESSION: No active cardiopulmonary disease. Electronically Signed   By: Delbert Phenix M.D.   On: 10/19/2019 20:19    Procedures Procedures (including critical care time)  Medications Ordered in UC Medications - No data to display  Initial Impression / Assessment and Plan / UC Course  I have reviewed the triage vital signs and the nursing notes.  Pertinent labs & imaging results that were available during my care of the patient were reviewed by me and considered in my medical decision making (see chart for details).     No fevers. No work of breathing. No hypoxia. Chest xray remains clear. Repeat cbc completed. Noted some increase in WBC and neuts, still taking augmentin, however. Additional prednisone provided for chest and cough symptoms. Return precautions provided. Patient verbalized understanding and agreeable to plan.   Final Clinical Impressions(s) / UC Diagnoses   Final diagnoses:  Cough     Discharge Instructions     Your chest xray remains clear.  I am going to do another course of prednisone.  You may continue with the previously prescribed cough syrups.  I have  repeated one blood test, likely won't change anything directly but will give [place] a little more insight into if anything has changed.  Please return for any worsening of symptoms.     ED Prescriptions    Medication Sig Dispense Auth. Provider   predniSONE (DELTASONE) 20 MG tablet 3-3-3-2-2-2-1-1-1 18 tablet Samaia Iwata B, NP   albuterol (PROVENTIL) (2.5 MG/3ML) 0.083% nebulizer solution Take 3 mLs (2.5 mg total) by nebulization every 6 (six) hours as needed for wheezing. 500 mL Georgetta Haber, NP     PDMP not reviewed this encounter.   Georgetta Haber, NP 10/21/19 [phone]

## 2019-10-19 NOTE — Discharge Instructions (Signed)
Your chest xray remains clear.  I am going to do another course of prednisone.  You may continue with the previously prescribed cough syrups.  I have repeated one blood test, likely won't change anything directly but will give [place] a little more insight into if anything has changed.  Please return for any worsening of symptoms.

## 2019-10-19 NOTE — ED Triage Notes (Signed)
Pt presents with dry cough. Pt was seen at [facility] on 10/11/19. States she is still taking antibiotic that was prescribed at that visit. States the cough and SOB has gotten worse.   States inhaler and neb treatments are not giving relief with cough or sob.   Pt was tested on 8/1 and 8/7 for COVID with negative result.

## 2019-10-20 LAB — SARS CORONAVIRUS 2 (TAT 6-24 HRS): SARS Coronavirus 2: NEGATIVE

## 2020-07-17 ENCOUNTER — Other Ambulatory Visit: Payer: Self-pay

## 2020-07-17 ENCOUNTER — Encounter: Payer: Self-pay | Admitting: Emergency Medicine

## 2020-07-17 ENCOUNTER — Ambulatory Visit
Admission: EM | Admit: 2020-07-17 | Discharge: 2020-07-17 | Disposition: A | Discharge status: Home / Self Care | Payer: No Typology Code available for payment source | Attending: Emergency Medicine | Admitting: Emergency Medicine

## 2020-07-17 DIAGNOSIS — K047 Periapical abscess without sinus: Secondary | ICD-10-CM

## 2020-07-17 MED ORDER — CLINDAMYCIN HCL 300 MG PO CAPS
300.0000 mg | ORAL_CAPSULE | Freq: Three times a day (TID) | ORAL | 0 refills | Status: AC
Start: 2020-07-17 — End: 2020-07-27

## 2020-07-17 MED ORDER — HYDROCODONE-ACETAMINOPHEN 5-325 MG PO TABS: 1.0000 | ORAL_TABLET | Freq: Four times a day (QID) | ORAL | 0 refills | Status: DC | PRN

## 2020-07-17 MED ORDER — IBUPROFEN 800 MG PO TABS
800.0000 mg | ORAL_TABLET | Freq: Three times a day (TID) | ORAL | 0 refills | Status: DC
Start: 2020-07-17 — End: 2023-03-25

## 2020-07-17 MED ORDER — CEFTRIAXONE SODIUM 1 G IJ SOLR
1.0000 g | Freq: Once | INTRAMUSCULAR | Status: AC
  Administered 2020-07-17: 1 g via INTRAMUSCULAR

## 2020-07-17 NOTE — ED Provider Notes (Signed)
[facility]    CSN: [id number] Arrival date & time: 07/17/20  1321      History   Chief Complaint Chief Complaint  Patient presents with  . Dental Pain    HPI Ashlee Herman is a 30 y.o. female presenting today for evaluation of dental pain and swelling.  Patient reports that began to develop dental pain to her right lower jaw yesterday, overnight has developed swelling to her face chin.  Also reports swelling to salivary gland under her tongue on left side.  Denies fevers.  Does report some increased discomfort in neck with movement towards right side.  HPI  Past Medical History:  Diagnosis Date  . Asthma 11/2009  . Diabetes mellitus   . Dyspepsia   . Fainting episodes   . Goiter   . H/O dysmenorrhea 11/2009  . H/O: menorrhagia 11/2009  . Hypothyroid   . Obesity   . Thyroid activity decreased   . Thyroiditis, autoimmune     Patient Active Problem List   Diagnosis Date Noted  . Acute otitis externa of both ears 10/06/2018  . Acute sore throat 10/06/2018  . Endometriosis 09/16/2018  . Atopic dermatitis 09/11/2018  . Generalized anxiety disorder 02/06/2017  . Mild persistent asthma without complication 03/19/2016  . Prediabetes   . Obesity   . Hypertension   . Dyspepsia   . Hypothyroid   . Pre-diabetes 06/12/2010    Past Surgical History:  Procedure Laterality Date  . CLEFT PALATE REPAIR    . TYMPANOSTOMY TUBE PLACEMENT      OB History   No obstetric history on file.      Home Medications    Prior to Admission medications   Medication Sig Start Date End Date Taking? Authorizing Provider  clindamycin (CLEOCIN) 300 MG capsule Take 1 capsule (300 mg total) by mouth 3 (three) times daily for 10 days. 07/17/20 07/27/20 Yes Nayshawn Mesta C, PA-C  HYDROcodone-acetaminophen (NORCO/VICODIN) 5-325 MG tablet Take 1-2 tablets by mouth every 6 (six) hours as needed. 07/17/20  Yes Vielka Klinedinst C, PA-C  ibuprofen (ADVIL) 800 MG tablet Take 1 tablet  (800 mg total) by mouth 3 (three) times daily. 07/17/20  Yes Brysten Reister C, PA-C  albuterol (PROVENTIL) (2.5 MG/3ML) 0.083% nebulizer solution Take 3 mLs (2.5 mg total) by nebulization every 6 (six) hours as needed for wheezing. 10/19/19   Georgetta Haber, NP  Elagolix Sodium (ORILISSA) 150 MG TABS Orilissa 150 mg tablet  Take 1 tablet every day by oral route as directed.    [provider]  EPINEPHrine 0.3 mg/0.3 mL IJ SOAJ injection Inject 0.3 mLs (0.3 mg total) into the muscle as needed for anaphylaxis. 09/08/18   Melene Plan, MD  levocetirizine (XYZAL) 5 MG tablet Take 5 mg by mouth every evening.    [provider]  levothyroxine (SYNTHROID) 25 MCG tablet Take 1 tablet (25 mcg total) by mouth daily before breakfast. 09/16/18   Melene Plan, MD  Multiple Vitamin (MULTIVITAMIN) tablet Take 1 tablet by mouth daily.    [provider]  norethindrone (CAMILA) 0.35 MG tablet Camila 0.35 mg tablet 09/16/18   Melene Plan, MD  PROAIR HFA 108 [phone] Base) MCG/ACT inhaler INHALE 1-2 PUFFS INTO THE LUNGS EVERY 6 (SIX) HOURS AS NEEDED FOR WHEEZING OR SHORTNESS OF BREATH. 10/24/17   Tillman Sers, DO  promethazine-codeine (PHENERGAN WITH CODEINE) 6.25-10 MG/5ML syrup Take 5 mLs by mouth every 6 (six) hours as needed for cough. Patient  not taking: Reported on 07/17/2020 10/12/19   Bing Neighbors, FNP  triamcinolone ointment (KENALOG) 0.5 % Apply 1 application topically 2 (two) times daily. 09/16/18   Melene Plan, MD    Family History Family History  Problem Relation Age of Onset  . Obesity Mother   . Hypertension Mother   . Obesity Father   . Cancer Maternal Grandmother   . Thyroid disease Neg Hx     Social History Social History   Tobacco Use  . Smoking status: Never Smoker  . Smokeless tobacco: Never Used  Vaping Use  . Vaping Use: Never used  Substance Use Topics  . Alcohol use: Yes    Comment: rare  . Drug use: No     Allergies   Tramadol   Review  of Systems Review of Systems  Constitutional: Negative for fatigue and fever.  HENT: Positive for dental problem and facial swelling. Negative for mouth sores.   Eyes: Negative for visual disturbance.  Respiratory: Negative for shortness of breath.   Cardiovascular: Negative for chest pain.  Gastrointestinal: Negative for abdominal pain, nausea and vomiting.  Genitourinary: Negative for genital sores.  Musculoskeletal: Negative for arthralgias and joint swelling.  Skin: Negative for color change, rash and wound.  Neurological: Negative for dizziness, weakness, light-headedness and headaches.     Physical Exam Triage Vital Signs ED Triage Vitals [07/17/20 1422]  Enc Vitals Group     BP 140/82     Pulse Rate 83     Resp 18     Temp 98.2 F (36.8 C)     Temp Source Oral     SpO2 99 %     Weight      Height      Head Circumference      Peak Flow      Pain Score 8     Pain Loc      Pain Edu?      Excl. in GC?    No data found.  Updated Vital Signs BP 140/82 (BP Location: Left Arm)   Pulse 83   Temp 98.2 F (36.8 C) (Oral)   Resp 18   SpO2 99%   Visual Acuity Right Eye Distance:   Left Eye Distance:   Bilateral Distance:    Right Eye Near:   Left Eye Near:    Bilateral Near:     Physical Exam Vitals and nursing note reviewed.  Constitutional:      Appearance: She is well-developed.     Comments: No acute distress  HENT:     Head: Normocephalic and atraumatic.     Comments: Moderate facial swelling noted to right mandibular area extending under submandibular line as well as submental area, no overlying erythema    Nose: Nose normal.     Mouth/Throat:     Comments: Gingival swelling and erythema noted to gingiva of right lower jaw with associated tenderness, mild swelling noted to left salivary gland underneath tongue on soft palate with associated tenderness Eyes:     Conjunctiva/sclera: Conjunctivae normal.  Neck:     Comments: Full active range of motion  of neck Cardiovascular:     Rate and Rhythm: Normal rate.  Pulmonary:     Effort: Pulmonary effort is normal. No respiratory distress.  Abdominal:     General: There is no distension.  Musculoskeletal:        General: Normal range of motion.     Cervical back: Neck supple.  Skin:  General: Skin is warm and dry.  Neurological:     Mental Status: She is alert and oriented to person, place, and time.      UC Treatments / Results  Labs (all labs ordered are listed, but only abnormal results are displayed) Labs Reviewed - No data to display  EKG   Radiology No results found.  Procedures Procedures (including critical care time)  Medications Ordered in UC Medications  cefTRIAXone (ROCEPHIN) injection 1 g (1 g Intramuscular Given 07/17/20 1445)    Initial Impression / Assessment and Plan / UC Course  I have reviewed the triage vital signs and the nursing notes.  Pertinent labs & imaging results that were available during my care of the patient were reviewed by me and considered in my medical decision making (see chart for details).     Dental abscess versus early Ludwig's angina-given swelling and pain extending below submandibular line as well as mild swelling to soft palate below the tongue concerning for deeper space infection.  Providing Rocephin and initiating clindamycin, advised patient if not seeing any improvement or symptoms progressing over the next 24 to 48 hours to go to the emergency room.  Discussed strict return precautions. Patient verbalized understanding and is agreeable with plan.  Final Clinical Impressions(s) / UC Diagnoses   Final diagnoses:  Dental abscess     Discharge Instructions     We gave you a shot of Rocephin Begin clindamycin 3 times daily for the next 10 days Warm compresses Tylenol and ibuprofen for mild to moderate pain Hydrocodone for severe pain If in the next 24 to 48 hours you are developing worsening swelling redness  fevers, difficulty swallowing or neck stiffness please go to emergency room    ED Prescriptions    Medication Sig Dispense Auth. Provider   clindamycin (CLEOCIN) 300 MG capsule Take 1 capsule (300 mg total) by mouth 3 (three) times daily for 10 days. 30 capsule Jonny Dearden C, PA-C   ibuprofen (ADVIL) 800 MG tablet Take 1 tablet (800 mg total) by mouth 3 (three) times daily. 21 tablet Sandralee Tarkington C, PA-C   HYDROcodone-acetaminophen (NORCO/VICODIN) 5-325 MG tablet Take 1-2 tablets by mouth every 6 (six) hours as needed. 12 tablet Comfort Iversen, [place] C, PA-C     I have reviewed the PDMP during this encounter.   Lew Dawes, PA-C 07/17/20 1702

## 2020-07-17 NOTE — ED Triage Notes (Signed)
Pt here with left sided lower dental pain with swelling x 2 days

## 2020-07-17 NOTE — Discharge Instructions (Addendum)
We gave you a shot of Rocephin Begin clindamycin 3 times daily for the next 10 days Warm compresses Tylenol and ibuprofen for mild to moderate pain Hydrocodone for severe pain If in the next 24 to 48 hours you are developing worsening swelling redness fevers, difficulty swallowing or neck stiffness please go to emergency room

## 2020-07-18 ENCOUNTER — Emergency Department (HOSPITAL_BASED_OUTPATIENT_CLINIC_OR_DEPARTMENT_OTHER): Payer: No Typology Code available for payment source

## 2020-07-18 ENCOUNTER — Encounter (HOSPITAL_BASED_OUTPATIENT_CLINIC_OR_DEPARTMENT_OTHER): Payer: Self-pay | Admitting: *Deleted

## 2020-07-18 ENCOUNTER — Other Ambulatory Visit: Payer: Self-pay

## 2020-07-18 ENCOUNTER — Emergency Department (HOSPITAL_BASED_OUTPATIENT_CLINIC_OR_DEPARTMENT_OTHER)
Admission: EM | Admit: 2020-07-18 | Discharge: 2020-07-18 | Disposition: A | Discharge status: Home / Self Care | Payer: No Typology Code available for payment source | Attending: Emergency Medicine | Admitting: Emergency Medicine

## 2020-07-18 DIAGNOSIS — K047 Periapical abscess without sinus: Secondary | ICD-10-CM | POA: Diagnosis not present

## 2020-07-18 DIAGNOSIS — E039 Hypothyroidism, unspecified: Secondary | ICD-10-CM | POA: Diagnosis not present

## 2020-07-18 DIAGNOSIS — I10 Essential (primary) hypertension: Secondary | ICD-10-CM | POA: Insufficient documentation

## 2020-07-18 DIAGNOSIS — K0889 Other specified disorders of teeth and supporting structures: Secondary | ICD-10-CM | POA: Diagnosis present

## 2020-07-18 DIAGNOSIS — J4530 Mild persistent asthma, uncomplicated: Secondary | ICD-10-CM | POA: Diagnosis not present

## 2020-07-18 DIAGNOSIS — E119 Type 2 diabetes mellitus without complications: Secondary | ICD-10-CM | POA: Insufficient documentation

## 2020-07-18 DIAGNOSIS — Z79899 Other long term (current) drug therapy: Secondary | ICD-10-CM | POA: Insufficient documentation

## 2020-07-18 LAB — CBC WITH DIFFERENTIAL/PLATELET
Abs Immature Granulocytes: 0.01 10*3/uL (ref 0.00–0.07)
Basophils Absolute: 0 10*3/uL (ref 0.0–0.1)
Basophils Relative: 0 %
Eosinophils Absolute: 0 10*3/uL (ref 0.0–0.5)
Eosinophils Relative: 0 %
HCT: 38.8 % (ref 36.0–46.0)
Hemoglobin: 13.1 g/dL (ref 12.0–15.0)
Immature Granulocytes: 0 %
Lymphocytes Relative: 10 %
Lymphs Abs: 0.7 10*3/uL (ref 0.7–4.0)
MCH: 30.2 pg (ref 26.0–34.0)
MCHC: 33.8 g/dL (ref 30.0–36.0)
MCV: 89.4 fL (ref 80.0–100.0)
Monocytes Absolute: 0.7 10*3/uL (ref 0.1–1.0)
Monocytes Relative: 9 %
Neutro Abs: 5.6 10*3/uL (ref 1.7–7.7)
Neutrophils Relative %: 81 %
Platelets: 268 10*3/uL (ref 150–400)
RBC: 4.34 MIL/uL (ref 3.87–5.11)
RDW: 12.3 % (ref 11.5–15.5)
WBC: 7 10*3/uL (ref 4.0–10.5)
nRBC: 0 % (ref 0.0–0.2)

## 2020-07-18 MED ORDER — HYDROMORPHONE HCL 1 MG/ML IJ SOLN
1.0000 mg | Freq: Once | INTRAMUSCULAR | Status: AC
  Administered 2020-07-18: 1 mg via INTRAVENOUS
  Filled 2020-07-18: qty 1

## 2020-07-18 MED ORDER — ONDANSETRON HCL 4 MG/2ML IJ SOLN
4.0000 mg | Freq: Once | INTRAMUSCULAR | Status: AC
  Administered 2020-07-18: 4 mg via INTRAVENOUS
  Filled 2020-07-18: qty 2

## 2020-07-18 MED ORDER — KETOROLAC TROMETHAMINE 30 MG/ML IJ SOLN
30.0000 mg | Freq: Once | INTRAMUSCULAR | Status: AC
  Administered 2020-07-18: 30 mg via INTRAVENOUS
  Filled 2020-07-18: qty 1

## 2020-07-18 MED ORDER — LIDOCAINE HCL (PF) 1 % IJ SOLN
5.0000 mL | Freq: Once | INTRAMUSCULAR | Status: AC
  Administered 2020-07-18: 5 mL
  Filled 2020-07-18: qty 5

## 2020-07-18 MED ORDER — SODIUM CHLORIDE 0.9 % IV SOLN
1.0000 g | Freq: Once | INTRAVENOUS | Status: AC
  Administered 2020-07-18: 1 g via INTRAVENOUS
  Filled 2020-07-18: qty 10

## 2020-07-18 NOTE — ED Triage Notes (Signed)
Dental abscess. Swelling to the left side of her face.

## 2020-07-18 NOTE — Discharge Instructions (Signed)
Please swish and spit with a one-to-one hydrogen peroxide and water mixture 3 times daily

## 2020-07-18 NOTE — ED Provider Notes (Signed)
[facility] EMERGENCY DEPARTMENT Provider Note   CSN: [id number] Arrival date & time: 07/18/20  1345     History Chief Complaint  Patient presents with  . Dental Problem    Ashlee Herman is a 30 y.o. female.  The patient was seen at an urgent care yesterday and was given rocephin IM and clinda.  The history is provided by the patient.  Dental Pain Location:  Lower Lower teeth location:  19/LL 1st molar Quality:  Throbbing Severity:  Severe Onset quality:  Gradual Duration:  2 days Timing:  Constant Progression:  Worsening Chronicity:  New Context: poor dentition   Relieved by:  Nothing Worsened by:  Pressure Associated symptoms: facial pain, facial swelling, fever (102 last night) and gum swelling   Associated symptoms: no difficulty swallowing and no drooling        Past Medical History:  Diagnosis Date  . Asthma 11/2009  . Diabetes mellitus   . Dyspepsia   . Fainting episodes   . Goiter   . H/O dysmenorrhea 11/2009  . H/O: menorrhagia 11/2009  . Hypothyroid   . Obesity   . Thyroid activity decreased   . Thyroiditis, autoimmune     Patient Active Problem List   Diagnosis Date Noted  . Acute otitis externa of both ears 10/06/2018  . Acute sore throat 10/06/2018  . Endometriosis 09/16/2018  . Atopic dermatitis 09/11/2018  . Generalized anxiety disorder 02/06/2017  . Mild persistent asthma without complication 03/19/2016  . Prediabetes   . Obesity   . Hypertension   . Dyspepsia   . Hypothyroid   . Pre-diabetes 06/12/2010    Past Surgical History:  Procedure Laterality Date  . CLEFT PALATE REPAIR    . TYMPANOSTOMY TUBE PLACEMENT       OB History   No obstetric history on file.     Family History  Problem Relation Age of Onset  . Obesity Mother   . Hypertension Mother   . Obesity Father   . Cancer Maternal Grandmother   . Thyroid disease Neg Hx     Social History   Tobacco Use  . Smoking status: Never Smoker  .  Smokeless tobacco: Never Used  Vaping Use  . Vaping Use: Never used  Substance Use Topics  . Alcohol use: Yes    Comment: rare  . Drug use: No    Home Medications Prior to Admission medications   Medication Sig Start Date End Date Taking? Authorizing Provider  albuterol (PROVENTIL) (2.5 MG/3ML) 0.083% nebulizer solution Take 3 mLs (2.5 mg total) by nebulization every 6 (six) hours as needed for wheezing. 10/19/19   Georgetta Haber, NP  clindamycin (CLEOCIN) 300 MG capsule Take 1 capsule (300 mg total) by mouth 3 (three) times daily for 10 days. 07/17/20 07/27/20  Wieters, Hallie C, PA-C  Elagolix Sodium (ORILISSA) 150 MG TABS Orilissa 150 mg tablet  Take 1 tablet every day by oral route as directed.    [provider]  EPINEPHrine 0.3 mg/0.3 mL IJ SOAJ injection Inject 0.3 mLs (0.3 mg total) into the muscle as needed for anaphylaxis. 09/08/18   Melene Plan, MD  HYDROcodone-acetaminophen (NORCO/VICODIN) 5-325 MG tablet Take 1-2 tablets by mouth every 6 (six) hours as needed. 07/17/20   Wieters, Hallie C, PA-C  ibuprofen (ADVIL) 800 MG tablet Take 1 tablet (800 mg total) by mouth 3 (three) times daily. 07/17/20   Wieters, Hallie C, PA-C  levocetirizine (XYZAL) 5 MG tablet Take 5 mg by  mouth every evening.    [provider]  levothyroxine (SYNTHROID) 25 MCG tablet Take 1 tablet (25 mcg total) by mouth daily before breakfast. 09/16/18   Melene Plan, MD  Multiple Vitamin (MULTIVITAMIN) tablet Take 1 tablet by mouth daily.    [provider]  norethindrone (CAMILA) 0.35 MG tablet Camila 0.35 mg tablet 09/16/18   Melene Plan, MD  PROAIR HFA 108 [phone] Base) MCG/ACT inhaler INHALE 1-2 PUFFS INTO THE LUNGS EVERY 6 (SIX) HOURS AS NEEDED FOR WHEEZING OR SHORTNESS OF BREATH. 10/24/17   Tillman Sers, DO  promethazine-codeine (PHENERGAN WITH CODEINE) 6.25-10 MG/5ML syrup Take 5 mLs by mouth every 6 (six) hours as needed for cough. Patient not taking: Reported on 07/17/2020 10/12/19    Bing Neighbors, FNP  triamcinolone ointment (KENALOG) 0.5 % Apply 1 application topically 2 (two) times daily. 09/16/18   Melene Plan, MD    Allergies    Tramadol  Review of Systems   Review of Systems  Constitutional: Positive for fever (102 last night). Negative for chills.  HENT: Positive for dental problem and facial swelling. Negative for drooling, ear pain and sore throat.   Eyes: Negative for pain and visual disturbance.  Respiratory: Negative for cough and shortness of breath.   Cardiovascular: Negative for chest pain and palpitations.  Gastrointestinal: Negative for abdominal pain and vomiting.  Genitourinary: Negative for dysuria and hematuria.  Musculoskeletal: Negative for arthralgias and back pain.  Skin: Negative for color change and rash.  Neurological: Negative for seizures and syncope.  All other systems reviewed and are negative.   Physical Exam Updated Vital Signs BP (!) 128/42 (BP Location: Left Arm)   Pulse 92   Temp 98.3 F (36.8 C) (Oral)   Resp 16   Ht 5\' 6"  (1.676 m)   Wt 117.9 kg   SpO2 98%   BMI 41.95 kg/m   Physical Exam Vitals and nursing note reviewed.  HENT:     Head: Normocephalic and atraumatic.     Jaw: No trismus.     Comments: Mild swelling at the left mandible and overlying tenderness to palpation.  No erythema.    Mouth/Throat:      Comments: Dental decay of the left first molar and associated gingival erythema without discrete abscess. Eyes:     General: No scleral icterus. Pulmonary:     Effort: Pulmonary effort is normal. No respiratory distress.  Musculoskeletal:     Cervical back: Normal range of motion.  Lymphadenopathy:     Cervical: Cervical adenopathy (left-sided) present.  Skin:    General: Skin is warm and dry.  Neurological:     General: No focal deficit present.     Mental Status: She is alert.  Psychiatric:        Mood and Affect: Mood normal.     ED Results / Procedures / Treatments   Labs (all  labs ordered are listed, but only abnormal results are displayed) Labs Reviewed  CBC WITH DIFFERENTIAL/PLATELET    EKG None  Radiology No results found.  Procedures . Incision and Drainage  Date/Time: 07/18/2020 8:21 PM Performed by: 07/20/2020, MD Authorized by: Koleen Distance, MD   Consent:    Consent obtained:  Verbal   Consent given by:  Patient   Risks, benefits, and alternatives were discussed: yes     Risks discussed:  Bleeding, incomplete drainage, infection and pain   Alternatives discussed:  No treatment, delayed treatment, alternative treatment, observation and referral  Universal protocol:    Immediately prior to procedure, a time out was called: yes     Patient identity confirmed:  Verbally with patient Location:    Type:  Abscess   Size:  Pinpoint   Location:  Mouth   Mouth location:  Alveolar process Anesthesia:    Anesthesia method:  Local infiltration   Local anesthetic:  Lidocaine 1% w/o epi Procedure type:    Complexity:  Simple Procedure details:    Incision types:  Stab incision   Incision depth:  Submucosal   Drainage:  Bloody   Drainage amount:  Scant   Wound treatment:  Wound left open   Packing materials:  None Post-procedure details:    Procedure completion:  Tolerated well, no immediate complications     Medications Ordered in ED Medications  ketorolac (TORADOL) 30 MG/ML injection 30 mg (has no administration in time range)  cefTRIAXone (ROCEPHIN) 1 g in sodium chloride 0.9 % 100 mL IVPB (has no administration in time range)    ED Course  I have reviewed the triage vital signs and the nursing notes.  Pertinent labs & imaging results that were available during my care of the patient were reviewed by me and considered in my medical decision making (see chart for details).    MDM Rules/Calculators/A&P                          MICHAELINE ECKERSLEY presents with an ongoing dental infection.  She has taken 3 pills of clindamycin and  still feeling bad.  Specifically, she has developed fever and swollen face.  She does state that she has dental insurance and should be able to follow-up with a dentist.  On exam, she is well-appearing.  She has noticeable facial swelling on the left side and swollen and erythematous gingiva immediately adjacent to the affected tooth.  CBC is within normal limits.  CT reveals periapical abscess.  I attempted I&D without success, and the patient was counseled to continue her antibiotic as well as to start hydrogen peroxide and water swish and spit.  She was given information for dentistry follow-up. Final Clinical Impression(s) / ED Diagnoses Final diagnoses:  Dental abscess    Rx / DC Orders ED Discharge Orders    None       Koleen Distance, MD 07/18/20 2022

## 2020-07-18 NOTE — ED Notes (Signed)
Patient transported to CT 

## 2020-07-18 NOTE — ED Notes (Signed)
I&D tray and lido at bedside 

## 2021-05-19 ENCOUNTER — Telehealth: Payer: No Typology Code available for payment source | Admitting: Physician Assistant

## 2021-05-19 DIAGNOSIS — J329 Chronic sinusitis, unspecified: Secondary | ICD-10-CM

## 2021-05-19 MED ORDER — AMOXICILLIN-POT CLAVULANATE 875-125 MG PO TABS: 1.0000 | ORAL_TABLET | Freq: Two times a day (BID) | ORAL | 0 refills | Status: AC

## 2021-05-19 NOTE — Progress Notes (Signed)

## 2021-08-24 IMAGING — US US PELVIS COMPLETE WITH TRANSVAGINAL
1 series · 14 of 25 positions shown · non-contrast
Comparison: 07/14/2014

CLINICAL DATA: Pelvic pain for 1 week

EXAM:
TRANSABDOMINAL AND TRANSVAGINAL ULTRASOUND OF PELVIS
TECHNIQUE: Both transabdominal and transvaginal ultrasound examinations of the
pelvis were performed. Transabdominal technique was performed for
global imaging of the pelvis including uterus, ovaries, adnexal
regions, and pelvic cul-de-sac. It was necessary to proceed with
endovaginal exam following the transabdominal exam to visualize the
LEFT ovary, and to characterize the RIGHT ovary.

[Series 1: us pelvis complete with transvaginal · 0.14mm/px · 14 of 82 slices shown]
[im 1/82]
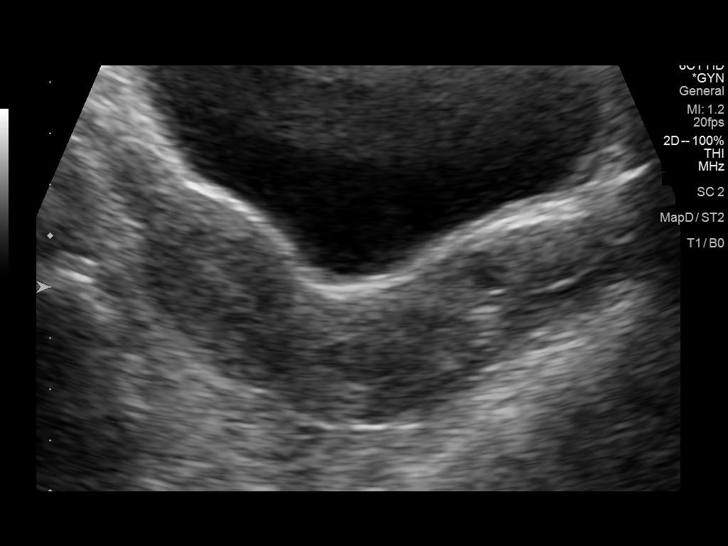
[im 7/82]
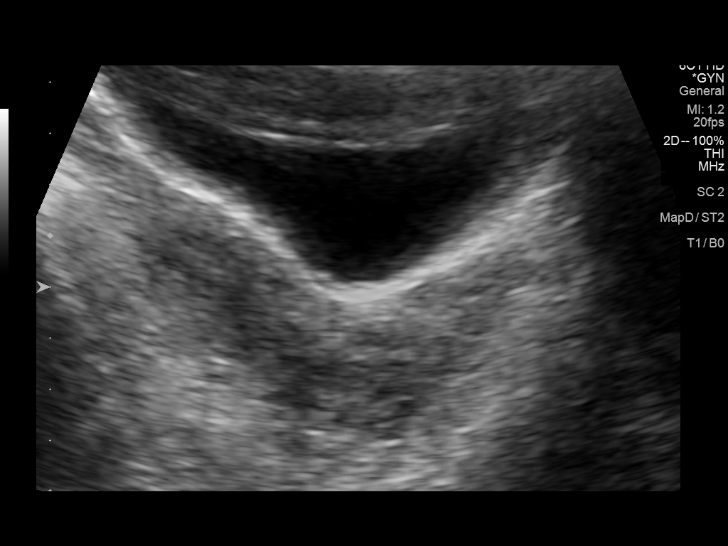
[im 14/82]
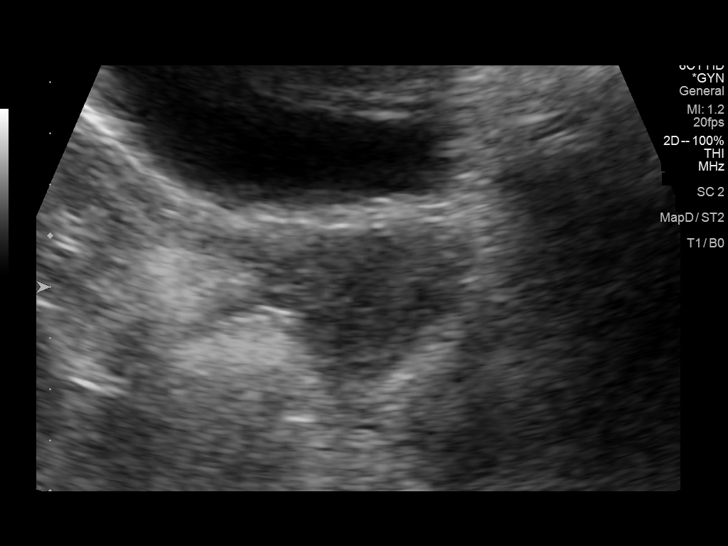
[im 21/82]
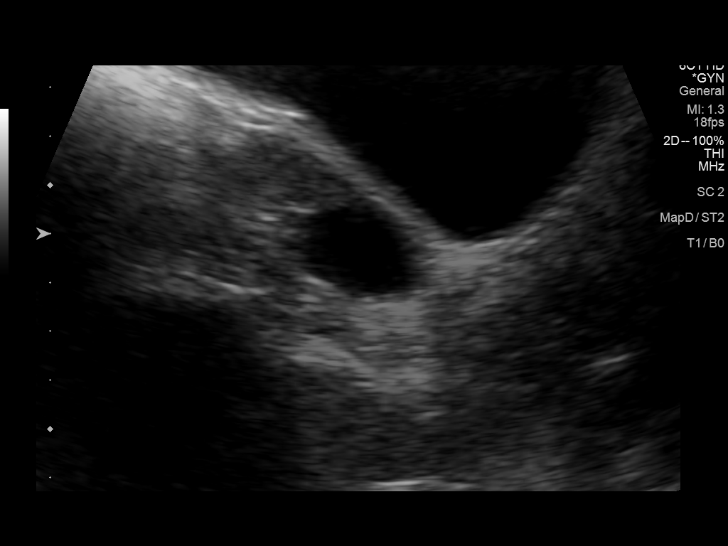
[im 28/82]
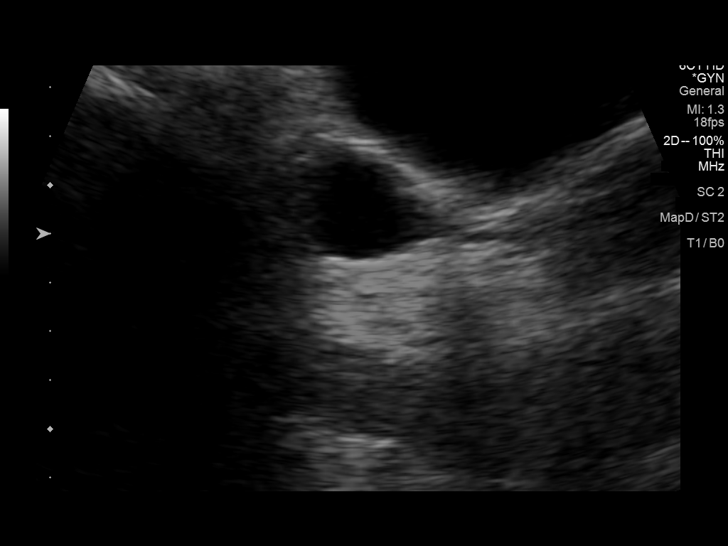
[im 31/82]
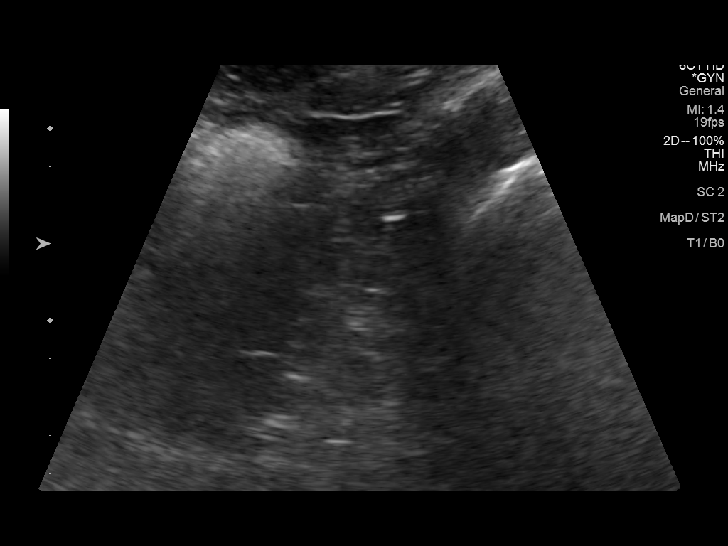
[im 38/82]
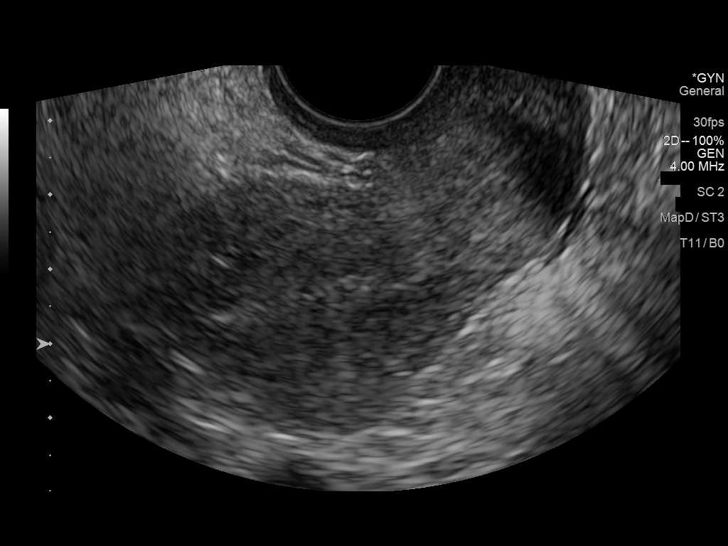
[im 44/82]
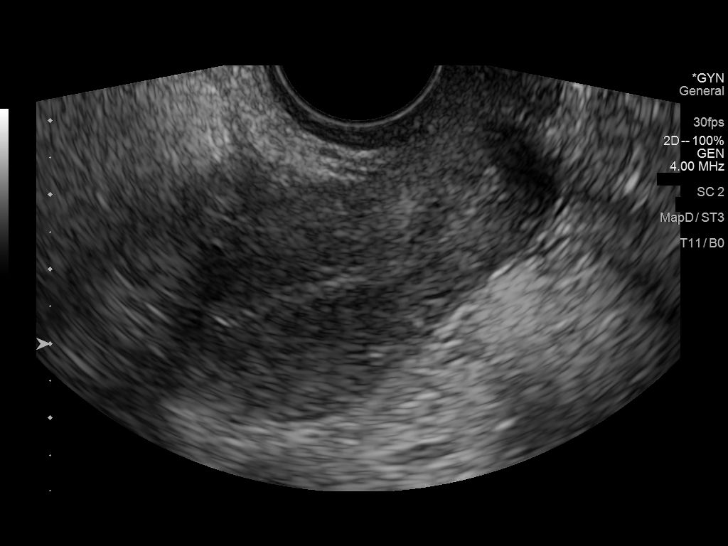
[im 51/82]
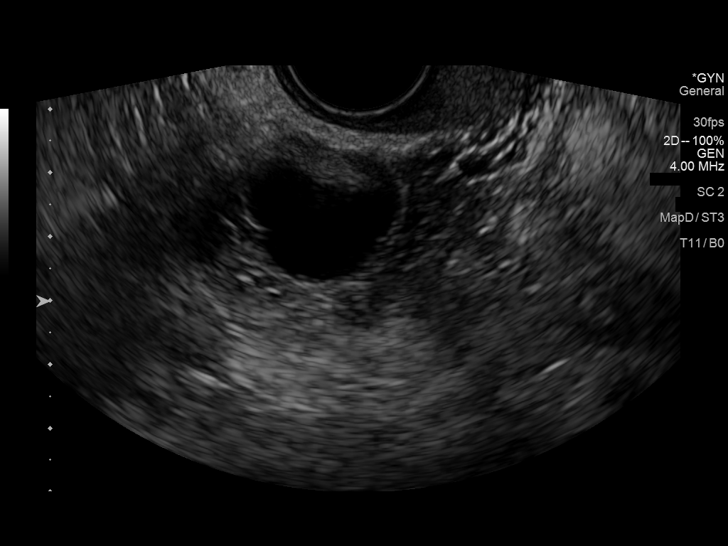
[im 55/82]
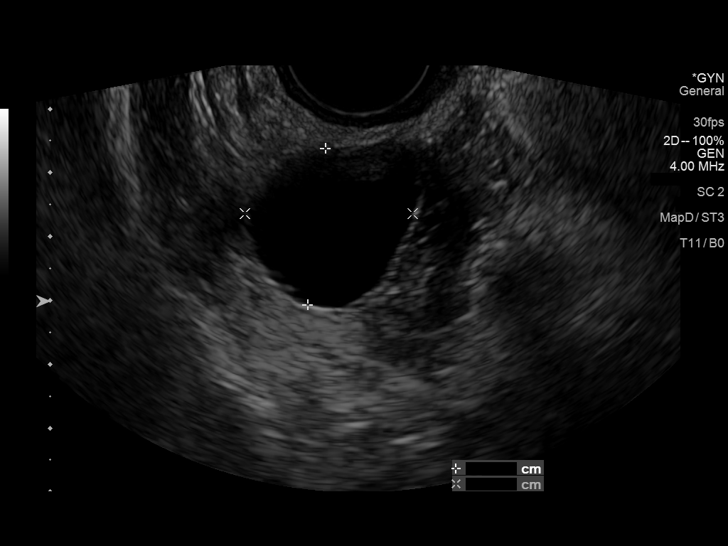
[im 61/82]
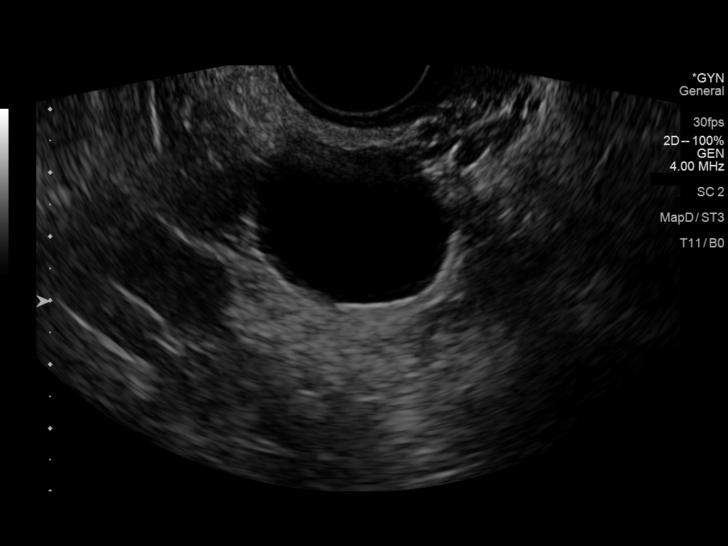
[im 68/82]
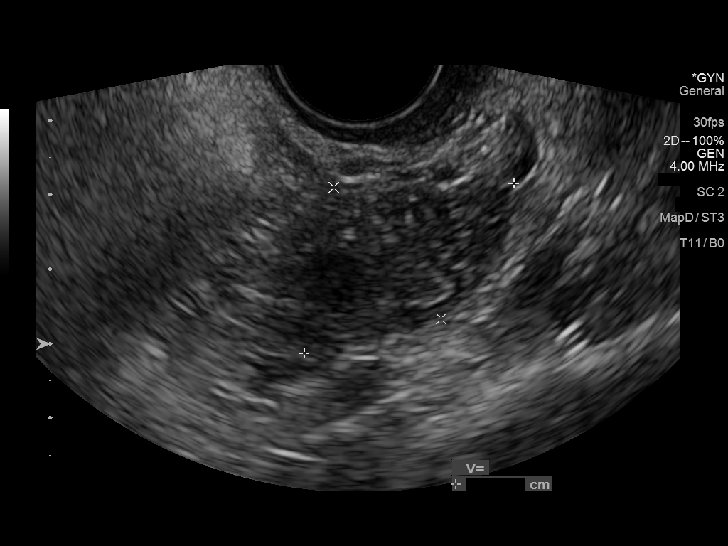
[im 75/82]
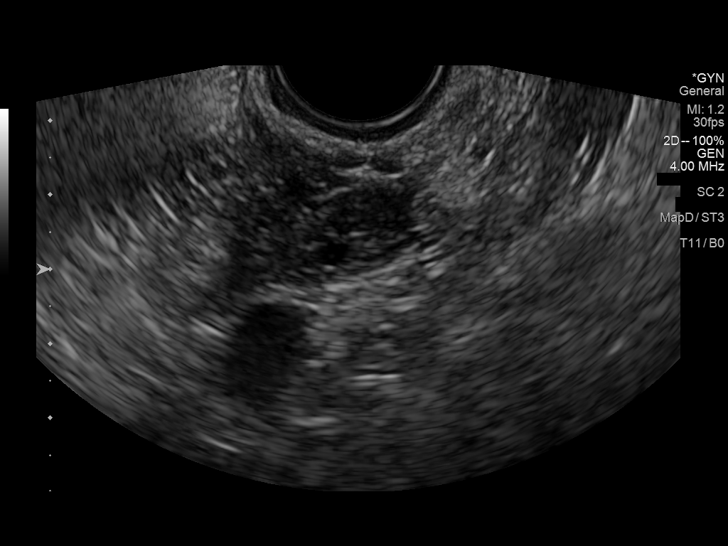
[im 82/82]
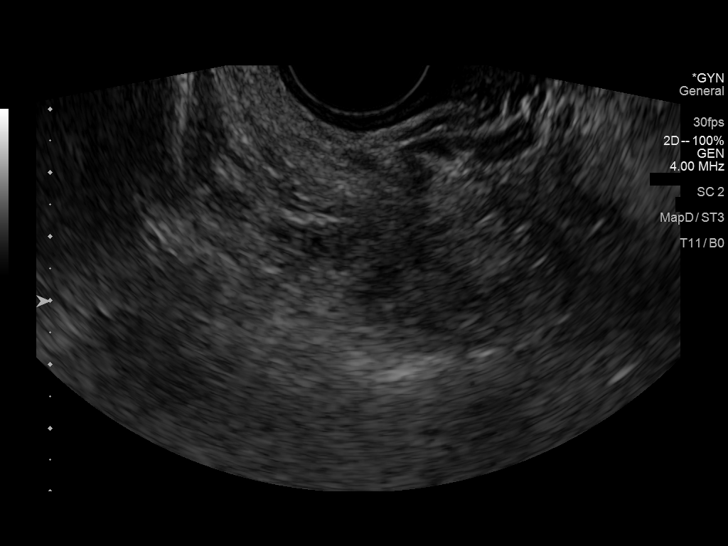

[14 of 25 positions shown; findings below may reference images not displayed]

FINDINGS: Uterus

Measurements: 5.8 x 3.1 x 4.5 cm = volume: 42 mL. Anteverted. Normal
morphology without mass

Endometrium

Thickness: 5 mm.  No endometrial fluid or focal abnormality

Right ovary

Measurements: 4.6 x 3.1 x 2.8 cm = volume: 21.3 mL. Dominant
follicle without mass

Left ovary

Measurements: 3.6 x 2.3 x 2.0 cm = volume: 8.7 mL. Normal morphology
without mass

Other findings

No free pelvic fluid.  No adnexal masses.
IMPRESSION: Normal exam.

## 2021-11-26 ENCOUNTER — Telehealth: Payer: Self-pay | Admitting: Family

## 2021-11-26 DIAGNOSIS — J069 Acute upper respiratory infection, unspecified: Secondary | ICD-10-CM

## 2021-11-26 MED ORDER — BENZONATATE 100 MG PO CAPS: 100.0000 mg | ORAL_CAPSULE | Freq: Three times a day (TID) | ORAL | 0 refills | Status: DC | PRN

## 2021-11-26 MED ORDER — PREDNISONE 10 MG (21) PO TBPK: ORAL_TABLET | ORAL | 0 refills | Status: DC

## 2021-11-26 MED ORDER — FLUTICASONE PROPIONATE 50 MCG/ACT NA SUSP: 2.0000 | Freq: Every day | NASAL | 6 refills | Status: DC

## 2021-11-26 NOTE — Progress Notes (Signed)

## 2021-11-27 ENCOUNTER — Ambulatory Visit
Admission: EM | Admit: 2021-11-27 | Discharge: 2021-11-27 | Disposition: A | Discharge status: Home / Self Care | Payer: No Typology Code available for payment source | Attending: Physician Assistant | Admitting: Physician Assistant

## 2021-11-27 DIAGNOSIS — J0140 Acute pansinusitis, unspecified: Secondary | ICD-10-CM | POA: Diagnosis not present

## 2021-11-27 MED ORDER — AMOXICILLIN-POT CLAVULANATE 875-125 MG PO TABS: 1.0000 | ORAL_TABLET | Freq: Two times a day (BID) | ORAL | 0 refills | Status: DC

## 2021-11-27 NOTE — ED Triage Notes (Signed)
Pt c/o nasal congestion, bilat ear aches/pressures, cough, frontal sinus pressure. Onset ~ thurs. 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   States was given prednisone, tessalon, and flonase during e-visit yesterday however pt has not picked those up.

## 2021-11-27 NOTE — ED Provider Notes (Signed)
[facility]    CSN: [id number] Arrival date & time: 11/27/21  1806      History   Chief Complaint Chief Complaint  Patient presents with   Nasal Congestion    HPI Ashlee Herman is a 31 y.o. female.   Patient here today for evaluation of nasal congestion and drainage, sinus pressure that started about 4 days ago.  She reports that she will get a sinus infection annually and reports current symptoms are similar.  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  She denies any fever.  She reports some mild cough today from drainage.  She did have COVID test that was negative.  The history is provided by the patient.    Past Medical History:  Diagnosis Date   Asthma 11/2009   Diabetes mellitus    Dyspepsia    Fainting episodes    Goiter    H/O dysmenorrhea 11/2009   H/O: menorrhagia 11/2009   Hypothyroid    Obesity    Thyroid activity decreased    Thyroiditis, autoimmune     Patient Active Problem List   Diagnosis Date Noted   Acute otitis externa of both ears 10/06/2018   Acute sore throat 10/06/2018   Endometriosis 09/16/2018   Atopic dermatitis 09/11/2018   Generalized anxiety disorder 02/06/2017   Mild persistent asthma without complication 38/12/1749   Prediabetes    Obesity    Hypertension    Dyspepsia    Hypothyroid    Pre-diabetes 06/12/2010    Past Surgical History:  Procedure Laterality Date   CLEFT PALATE REPAIR     TYMPANOSTOMY TUBE PLACEMENT      OB History   No obstetric history on file.      Home Medications    Prior to Admission medications   Medication Sig Start Date End Date Taking? Authorizing Provider  amoxicillin-clavulanate (AUGMENTIN) 875-125 MG tablet Take 1 tablet by mouth every 12 (twelve) hours. 11/27/21  Yes Francene Finders, PA-C  albuterol (PROVENTIL) (2.5 MG/3ML) 0.083% nebulizer solution Take 3 mLs (2.5 mg total) by nebulization every 6 (six) hours as needed for wheezing. 10/19/19    Zigmund Gottron, NP  benzonatate (TESSALON PERLES) 100 MG capsule Take 1 capsule (100 mg total) by mouth 3 (three) times daily as needed. 11/26/21   Sharion Balloon, FNP  Elagolix Sodium (ORILISSA) 150 MG TABS Orilissa 150 mg tablet  Take 1 tablet every day by oral route as directed.    [provider]  EPINEPHrine 0.3 mg/0.3 mL IJ SOAJ injection Inject 0.3 mLs (0.3 mg total) into the muscle as needed for anaphylaxis. 09/08/18   Wilber Oliphant, MD  fluticasone (FLONASE) 50 MCG/ACT nasal spray Place 2 sprays into both nostrils daily. 11/26/21   Sharion Balloon, FNP  HYDROcodone-acetaminophen (NORCO/VICODIN) 5-325 MG tablet Take 1-2 tablets by mouth every 6 (six) hours as needed. 07/17/20   Wieters, Hallie C, PA-C  ibuprofen (ADVIL) 800 MG tablet Take 1 tablet (800 mg total) by mouth 3 (three) times daily. 07/17/20   Wieters, Hallie C, PA-C  levocetirizine (XYZAL) 5 MG tablet Take 5 mg by mouth every evening.    [provider]  levothyroxine (SYNTHROID) 25 MCG tablet Take 1 tablet (25 mcg total) by mouth daily before breakfast. 09/16/18   Wilber Oliphant, MD  Multiple Vitamin (MULTIVITAMIN) tablet Take 1 tablet by mouth daily.    [provider]  norethindrone (CAMILA) 0.35 MG tablet Camila 0.35  mg tablet 09/16/18   Melene Plan, MD  predniSONE (STERAPRED UNI-PAK 21 TAB) 10 MG (21) TBPK tablet Use as directed 11/26/21   Jannifer Rodney A, FNP  PROAIR HFA 108 (90 Base) MCG/ACT inhaler INHALE 1-2 PUFFS INTO THE LUNGS EVERY 6 (SIX) HOURS AS NEEDED FOR WHEEZING OR SHORTNESS OF BREATH. 10/24/17   Tillman Sers, DO  promethazine-codeine (PHENERGAN WITH CODEINE) 6.25-10 MG/5ML syrup Take 5 mLs by mouth every 6 (six) hours as needed for cough. Patient not taking: Reported on 07/17/2020 10/12/19   Bing Neighbors, FNP  triamcinolone ointment (KENALOG) 0.5 % Apply 1 application topically 2 (two) times daily. 09/16/18   Melene Plan, MD    Family History Family History  Problem Relation  Age of Onset   Obesity Mother    Hypertension Mother    Obesity Father    Cancer Maternal Grandmother    Thyroid disease Neg Hx     Social History Social History   Tobacco Use   Smoking status: Never   Smokeless tobacco: Never  Vaping Use   Vaping Use: Never used  Substance Use Topics   Alcohol use: Yes    Comment: rare   Drug use: No     Allergies   Tramadol   Review of Systems Review of Systems  Constitutional:  Negative for chills and fever.  HENT:  Positive for congestion, sinus pressure and sore throat. Negative for ear pain.   Eyes:  Negative for discharge and redness.  Respiratory:  Positive for cough. Negative for shortness of breath and wheezing.   Gastrointestinal:  Negative for abdominal pain, diarrhea, nausea and vomiting.     Physical Exam Triage Vital Signs ED Triage Vitals [11/27/21 1908]  Enc Vitals Group     BP 119/87     Pulse Rate 90     Resp 16     Temp 98.4 F (36.9 C)     Temp Source Oral     SpO2 97 %     Weight      Height      Head Circumference      Peak Flow      Pain Score 7     Pain Loc      Pain Edu?      Excl. in GC?    No data found.  Updated Vital Signs BP 119/87 (BP Location: Left Arm)   Pulse 90   Temp 98.4 F (36.9 C) (Oral)   Resp 16   SpO2 97%      Physical Exam Vitals and nursing note reviewed.  Constitutional:      General: She is not in acute distress.    Appearance: Normal appearance. She is not ill-appearing.  HENT:     Head: Normocephalic and atraumatic.     Right Ear: Tympanic membrane normal.     Left Ear: Tympanic membrane normal.     Nose: Congestion present.  Eyes:     Conjunctiva/sclera: Conjunctivae normal.  Cardiovascular:     Rate and Rhythm: Normal rate and regular rhythm.     Heart sounds: Normal heart sounds. No murmur heard. Pulmonary:     Effort: Pulmonary effort is normal. No respiratory distress.     Breath sounds: Normal breath sounds. No wheezing, rhonchi or rales.   Skin:    General: Skin is warm and dry.  Neurological:     Mental Status: She is alert.  Psychiatric:        Mood and Affect:  Mood normal.        Thought Content: Thought content normal.      UC Treatments / Results  Labs (all labs ordered are listed, but only abnormal results are displayed) Labs Reviewed - No data to display  EKG   Radiology No results found.  Procedures Procedures (including critical care time)  Medications Ordered in UC Medications - No data to display  Initial Impression / Assessment and Plan / UC Course  I have reviewed the triage vital signs and the nursing notes.  Pertinent labs & imaging results that were available during my care of the patient were reviewed by me and considered in my medical decision making (see chart for details).    Augmentin prescribed to cover sinusitis.  Recommended follow-up if symptoms do not improve or with any further concerns.  Final Clinical Impressions(s) / UC Diagnoses   Final diagnoses:  Acute pansinusitis, recurrence not specified   Discharge Instructions   None    ED Prescriptions     Medication Sig Dispense Auth. Provider   amoxicillin-clavulanate (AUGMENTIN) 875-125 MG tablet Take 1 tablet by mouth every 12 (twelve) hours. 14 tablet Tomi Bamberger, PA-C      PDMP not reviewed this encounter.   Tomi Bamberger, PA-C 11/27/21 1956

## 2021-12-06 ENCOUNTER — Ambulatory Visit
Admission: EM | Admit: 2021-12-06 | Discharge: 2021-12-06 | Disposition: A | Discharge status: Home / Self Care | Payer: No Typology Code available for payment source | Attending: Physician Assistant | Admitting: Physician Assistant

## 2021-12-06 ENCOUNTER — Ambulatory Visit (INDEPENDENT_AMBULATORY_CARE_PROVIDER_SITE_OTHER): Payer: No Typology Code available for payment source

## 2021-12-06 DIAGNOSIS — R059 Cough, unspecified: Secondary | ICD-10-CM | POA: Diagnosis not present

## 2021-12-06 DIAGNOSIS — J209 Acute bronchitis, unspecified: Secondary | ICD-10-CM

## 2021-12-06 MED ORDER — PROMETHAZINE-DM 6.25-15 MG/5ML PO SYRP: 5.0000 mL | ORAL_SOLUTION | Freq: Four times a day (QID) | ORAL | 0 refills | Status: DC | PRN

## 2021-12-06 MED ORDER — ALBUTEROL SULFATE HFA 108 (90 BASE) MCG/ACT IN AERS: 1.0000 | INHALATION_SPRAY | Freq: Four times a day (QID) | RESPIRATORY_TRACT | 0 refills | Status: DC | PRN

## 2021-12-06 NOTE — Discharge Instructions (Signed)
  Chest Xray negative for acute abnormality.   Please trial regular use of inhaler over the next few days- if no improvement please follow up with PCP or report to ED with worsening.

## 2021-12-06 NOTE — ED Provider Notes (Signed)
[facility]    CSN: [id number] Arrival date & time: 12/06/21  1808      History   Chief Complaint Chief Complaint  Patient presents with   Cough    HPI Ashlee Herman is a 31 y.o. female.   Here today for evaluation of continued nonproductive cough.  She reports symptoms started about 2 weeks ago and shortly after she was seen both by primary care as well as urgent care.  She was treated with steroid taper as well as antibiotic therapy.  She notes that cough seems to be worsening instead of improving.  She now feels as if there is a dry tickle in the back of her throat causing cough.  Last night she did see white spots on her tonsils as well which had her worried.  She has not had recent fever. She has finished medications prescribed with no resolution of cough.   The history is provided by the patient.  Cough Associated symptoms: shortness of breath   Associated symptoms: no chills, no ear pain, no eye discharge, no fever, no sore throat and no wheezing     Past Medical History:  Diagnosis Date   Asthma 11/2009   Diabetes mellitus    Dyspepsia    Fainting episodes    Goiter    H/O dysmenorrhea 11/2009   H/O: menorrhagia 11/2009   Hypothyroid    Obesity    Thyroid activity decreased    Thyroiditis, autoimmune     Patient Active Problem List   Diagnosis Date Noted   Acute otitis externa of both ears 10/06/2018   Acute sore throat 10/06/2018   Endometriosis 09/16/2018   Atopic dermatitis 09/11/2018   Generalized anxiety disorder 02/06/2017   Mild persistent asthma without complication 03/19/2016   Prediabetes    Obesity    Hypertension    Dyspepsia    Hypothyroid    Pre-diabetes 06/12/2010    Past Surgical History:  Procedure Laterality Date   CLEFT PALATE REPAIR     TYMPANOSTOMY TUBE PLACEMENT      OB History   No obstetric history on file.      Home Medications    Prior to Admission medications   Medication Sig Start Date End Date  Taking? Authorizing Provider  albuterol (VENTOLIN HFA) 108 (90 Base) MCG/ACT inhaler Inhale 1-2 puffs into the lungs every 6 (six) hours as needed for wheezing or shortness of breath. 12/06/21  Yes Tomi Bamberger, PA-C  promethazine-dextromethorphan (PROMETHAZINE-DM) 6.25-15 MG/5ML syrup Take 5 mLs by mouth 4 (four) times daily as needed for cough. 12/06/21  Yes Tomi Bamberger, PA-C  albuterol (PROVENTIL) (2.5 MG/3ML) 0.083% nebulizer solution Take 3 mLs (2.5 mg total) by nebulization every 6 (six) hours as needed for wheezing. 10/19/19   Georgetta Haber, NP  amoxicillin-clavulanate (AUGMENTIN) 875-125 MG tablet Take 1 tablet by mouth every 12 (twelve) hours. 11/27/21   Tomi Bamberger, PA-C  benzonatate (TESSALON PERLES) 100 MG capsule Take 1 capsule (100 mg total) by mouth 3 (three) times daily as needed. 11/26/21   Junie Spencer, FNP  Elagolix Sodium (ORILISSA) 150 MG TABS Orilissa 150 mg tablet  Take 1 tablet every day by oral route as directed.    [provider]  EPINEPHrine 0.3 mg/0.3 mL IJ SOAJ injection Inject 0.3 mLs (0.3 mg total) into the muscle as needed for anaphylaxis. 09/08/18   Melene Plan, MD  fluticasone (FLONASE) 50 MCG/ACT nasal spray Place 2 sprays into both nostrils  daily. 11/26/21   Sharion Balloon, FNP  HYDROcodone-acetaminophen (NORCO/VICODIN) 5-325 MG tablet Take 1-2 tablets by mouth every 6 (six) hours as needed. 07/17/20   Wieters, Hallie C, PA-C  ibuprofen (ADVIL) 800 MG tablet Take 1 tablet (800 mg total) by mouth 3 (three) times daily. 07/17/20   Wieters, Hallie C, PA-C  levocetirizine (XYZAL) 5 MG tablet Take 5 mg by mouth every evening.    [provider]  levothyroxine (SYNTHROID) 25 MCG tablet Take 1 tablet (25 mcg total) by mouth daily before breakfast. 09/16/18   Wilber Oliphant, MD  Multiple Vitamin (MULTIVITAMIN) tablet Take 1 tablet by mouth daily.    [provider]  norethindrone (CAMILA) 0.35 MG tablet Camila 0.35 mg tablet 09/16/18    Wilber Oliphant, MD  predniSONE (STERAPRED UNI-PAK 21 TAB) 10 MG (21) TBPK tablet Use as directed 11/26/21   Evelina Dun A, FNP  PROAIR HFA 108 (90 Base) MCG/ACT inhaler INHALE 1-2 PUFFS INTO THE LUNGS EVERY 6 (SIX) HOURS AS NEEDED FOR WHEEZING OR SHORTNESS OF BREATH. 10/24/17   Steve Rattler, DO  promethazine-codeine (PHENERGAN WITH CODEINE) 6.25-10 MG/5ML syrup Take 5 mLs by mouth every 6 (six) hours as needed for cough. Patient not taking: Reported on 07/17/2020 10/12/19   Scot Jun, FNP  triamcinolone ointment (KENALOG) 0.5 % Apply 1 application topically 2 (two) times daily. 09/16/18   Wilber Oliphant, MD    Family History Family History  Problem Relation Age of Onset   Obesity Mother    Hypertension Mother    Obesity Father    Cancer Maternal Grandmother    Thyroid disease Neg Hx     Social History Social History   Tobacco Use   Smoking status: Never   Smokeless tobacco: Never  Vaping Use   Vaping Use: Never used  Substance Use Topics   Alcohol use: Yes    Comment: rare   Drug use: No     Allergies   Tramadol   Review of Systems Review of Systems  Constitutional:  Negative for chills and fever.  HENT:  Negative for congestion, ear pain and sore throat.   Eyes:  Negative for discharge and redness.  Respiratory:  Positive for cough and shortness of breath. Negative for wheezing.   Gastrointestinal:  Negative for abdominal pain, diarrhea, nausea and vomiting.     Physical Exam Triage Vital Signs ED Triage Vitals [12/06/21 1822]  Enc Vitals Group     BP 138/80     Pulse Rate 100     Resp 18     Temp 98 F (36.7 C)     Temp Source Oral     SpO2 98 %     Weight      Height      Head Circumference      Peak Flow      Pain Score 0     Pain Loc      Pain Edu?      Excl. in [place]?    No data found.  Updated Vital Signs BP 138/80 (BP Location: Left Arm)   Pulse 100   Temp 98 F (36.7 C) (Oral)   Resp 18   SpO2 98%   Physical Exam Vitals and  nursing note reviewed.  Constitutional:      General: She is not in acute distress.    Appearance: Normal appearance. She is not ill-appearing.  HENT:     Head: Normocephalic and atraumatic.  Nose: Nose normal. No congestion or rhinorrhea.     Mouth/Throat:     Pharynx: Oropharynx is clear. No oropharyngeal exudate or posterior oropharyngeal erythema.  Eyes:     Conjunctiva/sclera: Conjunctivae normal.  Cardiovascular:     Rate and Rhythm: Normal rate and regular rhythm.     Heart sounds: Normal heart sounds.  Pulmonary:     Effort: Pulmonary effort is normal. No respiratory distress.     Breath sounds: Normal breath sounds. No wheezing, rhonchi or rales.     Comments: Deep breathing produces cough Neurological:     Mental Status: She is alert.  Psychiatric:        Mood and Affect: Mood normal.        Behavior: Behavior normal.      UC Treatments / Results  Labs (all labs ordered are listed, but only abnormal results are displayed) Labs Reviewed - No data to display  EKG   Radiology DG Chest 2 View  Result Date: 12/06/2021 CLINICAL DATA:  Cough x2 weeks EXAM: CHEST - 2 VIEW COMPARISON:  10/19/2019 FINDINGS: Lungs are clear.  No pleural effusion or pneumothorax. The heart is normal in size. Visualized osseous structures are within normal limits. IMPRESSION: Normal chest radiographs. Electronically Signed   By: Charline Bills M.D.   On: 12/06/2021 19:03    Procedures Procedures (including critical care time)  Medications Ordered in UC Medications - No data to display  Initial Impression / Assessment and Plan / UC Course  I have reviewed the triage vital signs and the nursing notes.  Pertinent labs & imaging results that were available during my care of the patient were reviewed by me and considered in my medical decision making (see chart for details).   Normal CXR .Will treat with albuterol inhaler and cough syrup given patient already prescribed steroid and  antibiotic without resolution of symptoms. Encouraged follow up with any persistent or worsening symptoms.    Final Clinical Impressions(s) / UC Diagnoses   Final diagnoses:  Acute bronchitis, unspecified organism     Discharge Instructions       Chest Xray negative for acute abnormality.   Please trial regular use of inhaler over the next few days- if no improvement please follow up with PCP or report to ED with worsening.      ED Prescriptions     Medication Sig Dispense Auth. Provider   albuterol (VENTOLIN HFA) 108 (90 Base) MCG/ACT inhaler Inhale 1-2 puffs into the lungs every 6 (six) hours as needed for wheezing or shortness of breath. 8 g Tomi Bamberger, PA-C   promethazine-dextromethorphan (PROMETHAZINE-DM) 6.25-15 MG/5ML syrup Take 5 mLs by mouth 4 (four) times daily as needed for cough. 118 mL Tomi Bamberger, PA-C      PDMP not reviewed this encounter.   Tomi Bamberger, PA-C 12/06/21 1927

## 2021-12-06 NOTE — ED Triage Notes (Signed)
Pt c/o being seen for ear and sinus infection 7 days ago here. Now has a cough and feeling "winded." *denies pain in triage*

## 2021-12-27 ENCOUNTER — Other Ambulatory Visit: Payer: Self-pay

## 2021-12-27 MED ORDER — INFLUENZA VAC SPLIT QUAD 0.5 ML IM SUSY
0.5000 mL | PREFILLED_SYRINGE | Freq: Once | INTRAMUSCULAR | 0 refills | Status: AC
  Filled 2021-12-27: qty 0.5, 1d supply, fill #0

## 2022-07-18 ENCOUNTER — Other Ambulatory Visit: Payer: Self-pay

## 2022-12-17 ENCOUNTER — Other Ambulatory Visit: Payer: Self-pay

## 2022-12-17 MED ORDER — INFLUENZA VIRUS VACC SPLIT PF (FLUZONE) 0.5 ML IM SUSY
0.5000 mL | PREFILLED_SYRINGE | Freq: Once | INTRAMUSCULAR | 0 refills | Status: AC
  Filled 2022-12-17: qty 0.5, 1d supply, fill #0

## 2022-12-18 ENCOUNTER — Other Ambulatory Visit: Payer: Self-pay

## 2023-01-02 ENCOUNTER — Other Ambulatory Visit: Payer: Self-pay

## 2023-01-21 ENCOUNTER — Other Ambulatory Visit: Payer: Self-pay

## 2023-01-21 ENCOUNTER — Ambulatory Visit: Payer: 59 | Attending: Internal Medicine | Admitting: Internal Medicine

## 2023-01-21 DIAGNOSIS — Z0000 Encounter for general adult medical examination without abnormal findings: Secondary | ICD-10-CM

## 2023-01-21 DIAGNOSIS — Z5320 Procedure and treatment not carried out because of patient's decision for unspecified reasons: Secondary | ICD-10-CM | POA: Diagnosis not present

## 2023-01-21 DIAGNOSIS — J4520 Mild intermittent asthma, uncomplicated: Secondary | ICD-10-CM | POA: Diagnosis not present

## 2023-01-21 DIAGNOSIS — E66813 Obesity, class 3: Secondary | ICD-10-CM | POA: Diagnosis not present

## 2023-01-21 DIAGNOSIS — R7303 Prediabetes: Secondary | ICD-10-CM

## 2023-01-21 DIAGNOSIS — F320 Major depressive disorder, single episode, mild: Secondary | ICD-10-CM

## 2023-01-21 DIAGNOSIS — Z23 Encounter for immunization: Secondary | ICD-10-CM

## 2023-01-21 DIAGNOSIS — Z6841 Body Mass Index (BMI) 40.0 and over, adult: Secondary | ICD-10-CM | POA: Diagnosis not present

## 2023-01-21 DIAGNOSIS — Z0001 Encounter for general adult medical examination with abnormal findings: Secondary | ICD-10-CM

## 2023-01-21 DIAGNOSIS — F411 Generalized anxiety disorder: Secondary | ICD-10-CM

## 2023-01-21 DIAGNOSIS — E039 Hypothyroidism, unspecified: Secondary | ICD-10-CM

## 2023-01-21 LAB — POCT GLYCOSYLATED HEMOGLOBIN (HGB A1C): HbA1c, POC (prediabetic range): 5.7 % (ref 5.7–6.4)

## 2023-01-21 LAB — GLUCOSE, POCT (MANUAL RESULT ENTRY): POC Glucose: 108 mg/dL — AB (ref 70–99)

## 2023-01-21 MED ORDER — FLUTICASONE PROPIONATE 50 MCG/ACT NA SUSP
2.0000 | Freq: Every day | NASAL | 6 refills | Status: DC
  Filled 2023-01-21: qty 16, fill #0

## 2023-01-21 MED ORDER — ALBUTEROL SULFATE HFA 108 (90 BASE) MCG/ACT IN AERS
1.0000 | INHALATION_SPRAY | Freq: Four times a day (QID) | RESPIRATORY_TRACT | 4 refills | Status: AC | PRN
  Filled 2023-01-21: qty 8, fill #0

## 2023-01-21 MED ORDER — TETANUS-DIPHTH-ACELL PERTUSSIS 5-2-15.5 LF-MCG/0.5 IM SUSP: 0.5000 mL | Freq: Once | INTRAMUSCULAR | 0 refills | Status: AC

## 2023-01-21 MED ORDER — ESCITALOPRAM OXALATE 5 MG PO TABS
5.0000 mg | ORAL_TABLET | Freq: Every day | ORAL | 1 refills | Status: DC
  Filled 2023-01-21: qty 30, 30d supply, fill #0

## 2023-01-21 MED ORDER — LEVOCETIRIZINE DIHYDROCHLORIDE 5 MG PO TABS
5.0000 mg | ORAL_TABLET | Freq: Every evening | ORAL | 1 refills | Status: DC
  Filled 2023-01-21: qty 90, 90d supply, fill #0

## 2023-01-21 MED ORDER — ALBUTEROL SULFATE (2.5 MG/3ML) 0.083% IN NEBU
2.5000 mg | INHALATION_SOLUTION | Freq: Four times a day (QID) | RESPIRATORY_TRACT | 0 refills | Status: AC | PRN
  Filled 2023-01-21: qty 500, 42d supply, fill #0

## 2023-01-21 NOTE — Patient Instructions (Addendum)
Try to get in with a counselor through the EAP program with your employer. Start Lexapro 5 mg daily for 4 weeks.  We will see you back in 6 weeks.  If you are tolerating the medication we will increase the dose to 10 mg.  Preventive Care 30-30 Years Old, Female Preventive care refers to lifestyle choices and visits with your health care provider that can promote health and wellness. Preventive care visits are also called wellness exams. What can I expect for my preventive care visit? Counseling During your preventive care visit, your health care provider may ask about your: Medical history, including: Past medical problems. Family medical history. Pregnancy history. Current health, including: Menstrual cycle. Method of birth control. Emotional well-being. Home life and relationship well-being. Sexual activity and sexual health. Lifestyle, including: Alcohol, nicotine or tobacco, and drug use. Access to firearms. Diet, exercise, and sleep habits. Work and work Astronomer. Sunscreen use. Safety issues such as seatbelt and bike helmet use. Physical exam Your health care provider may check your: Height and weight. These may be used to calculate your BMI (body mass index). BMI is a measurement that tells if you are at a healthy weight. Waist circumference. This measures the distance around your waistline. This measurement also tells if you are at a healthy weight and may help predict your risk of certain diseases, such as type 2 diabetes and high blood pressure. Heart rate and blood pressure. Body temperature. Skin for abnormal spots. What immunizations do I need?  Vaccines are usually given at various ages, according to a schedule. Your health care provider will recommend vaccines for you based on your age, medical history, and lifestyle or other factors, such as travel or where you work. What tests do I need? Screening Your health care provider may recommend screening tests for  certain conditions. This may include: Pelvic exam and Pap test. Lipid and cholesterol levels. Diabetes screening. This is done by checking your blood sugar (glucose) after you have not eaten for a while (fasting). Hepatitis B test. Hepatitis C test. HIV (human immunodeficiency virus) test. STI (sexually transmitted infection) testing, if you are at risk. BRCA-related cancer screening. This may be done if you have a family history of breast, ovarian, tubal, or peritoneal cancers. Talk with your health care provider about your test results, treatment options, and if necessary, the need for more tests. Follow these instructions at home: Eating and drinking  Eat a healthy diet that includes fresh fruits and vegetables, whole grains, lean protein, and low-fat dairy products. Take vitamin and mineral supplements as recommended by your health care provider. Do not drink alcohol if: Your health care provider tells you not to drink. You are pregnant, may be pregnant, or are planning to become pregnant. If you drink alcohol: Limit how much you have to 0-1 drink a day. Know how much alcohol is in your drink. In the [place] one drink equals one 12 oz bottle of beer (355 mL), one 5 oz glass of wine (148 mL), or one 1 oz glass of hard liquor (44 mL). Lifestyle Brush your teeth every morning and night with fluoride toothpaste. Floss one time each day. Exercise for at least 30 minutes 5 or more days each week. Do not use any products that contain nicotine or tobacco. These products include cigarettes, chewing tobacco, and vaping devices, such as e-cigarettes. If you need help quitting, ask your health care provider. Do not use drugs. If you are sexually active, practice safe sex. Use a  condom or other form of protection to prevent STIs. If you do not wish to become pregnant, use a form of birth control. If you plan to become pregnant, see your health care provider for a prepregnancy visit. Find healthy  ways to manage stress, such as: Meditation, yoga, or listening to music. Journaling. Talking to a trusted person. Spending time with friends and family. Minimize exposure to UV radiation to reduce your risk of skin cancer. Safety Always wear your seat belt while driving or riding in a vehicle. Do not drive: If you have been drinking alcohol. Do not ride with someone who has been drinking. If you have been using any mind-altering substances or drugs. While texting. When you are tired or distracted. Wear a helmet and other protective equipment during sports activities. If you have firearms in your house, make sure you follow all gun safety procedures. Seek help if you have been physically or sexually abused. What's next? Go to your health care provider once a year for an annual wellness visit. Ask your health care provider how often you should have your eyes and teeth checked. Stay up to date on all vaccines. This information is not intended to replace advice given to you by your health care provider. Make sure you discuss any questions you have with your health care provider. Document Revised: 08/17/2020 Document Reviewed: 08/17/2020 Elsevier Patient Education  2024 [employer].

## 2023-01-21 NOTE — Progress Notes (Signed)
Patient ID: Ashlee Herman, female    DOB: 1990/03/08  MRN: [id number]  CC: Establish Care (Est care / new pt. [name] lab work - thyroid/Will go to our [facility] for Tdap. Will make appt with gyn for pap)   Subjective: Ashlee Herman is a 32 y.o. female who presents for new pt visit and request physical Her concerns today include:  Patient with history of mild persistent asthma, hypothyroidism, obesity/prediabetes, GAD, endometriosis  Previous PCP was at [facility]; but not seen in a while.  Hx of HTN on chart but pt reports no prior hx and not on med  Asthma:  uses ProAir Q 4-6 hrs PRN usually only when she has a acute respiratory illness.  Last used in May or June.  Not on maintenance. Hx with asthma as a child, never intubated.  Hx of Hypothyroid dx in middle school.  Was on Levothyroxine for 8 yrs, then was able to be taken off.  Restarted while seeing a FP resident.  Off med x >3 yrs. Reports feeling hot in head and neck at times.  Has had wgh gain.  PreDM/Obesity:  was on Metformin BID until 2014. Up 30 lbs since 07/2020 -Trying to get back to gym and with eating habits.  Struggling taking care of her parents and not prioritizing her own health. Lives with her BF but both parents are elderly with chronic health conditions.  Has 2 siblings but both live in [place].  Mom is in a rehab ctr in [place] and father lives here in [place].  She helps take care of her father; worries less about her mother's care now that she is in a rehab facility. Feels she needs to do meal planning and decrease starch.  Drinks water, juice, coffee from [employer].   Pos dep score/GAD:  having a lot of depression and anxiety "and I'm struggling with it."  Struggles to do things. Had a mild panic attack driving home yesterday.  Had seen [facility] in 2012 and place on Lexapro.  Felt it did not help so stopped taking after a short period.  Found ways to deal with it like deep breathing exercises.   Now that parents in bad health, she is feeling more overwhelmed.  HM: Plans to get Tdap vaccine at our pharmacy downstairs.  Due for Pap smear.  She will call and schedule with her gynecologist.   Patient Active Problem List   Diagnosis Date Noted   Acute otitis externa of both ears 10/06/2018   Acute sore throat 10/06/2018   Endometriosis 09/16/2018   Atopic dermatitis 09/11/2018   Generalized anxiety disorder 02/06/2017   Mild persistent asthma without complication 03/19/2016   Prediabetes    Obesity    Hypertension    Dyspepsia    Hypothyroid    Pre-diabetes 06/12/2010     Current Outpatient Medications on File Prior to Visit  Medication Sig Dispense Refill   ibuprofen (ADVIL) 800 MG tablet Take 1 tablet (800 mg total) by mouth 3 (three) times daily. 21 tablet 0   Multiple Vitamin (MULTIVITAMIN) tablet Take 1 tablet by mouth daily.     PROAIR HFA 108 (90 Base) MCG/ACT inhaler INHALE 1-2 PUFFS INTO THE LUNGS EVERY 6 (SIX) HOURS AS NEEDED FOR WHEEZING OR SHORTNESS OF BREATH. (Patient not taking: Reported on 01/21/2023) 8.5 Inhaler 2   No current facility-administered medications on file prior to visit.    Allergies  Allergen Reactions   Tramadol     Social History  Socioeconomic History   Marital status: Single    Spouse name: Not on file   Number of children: Not on file   Years of education: Not on file   Highest education level: Some college, no degree  Occupational History   Not on file  Tobacco Use   Smoking status: Never   Smokeless tobacco: Never  Vaping Use   Vaping status: Never Used  Substance and Sexual Activity   Alcohol use: Yes    Comment: Occasional drinker   Drug use: No   Sexual activity: Yes    Birth control/protection: None  Other Topics Concern   Not on file  Social History Narrative   Not on file   Social Determinants of Health   Financial Resource Strain: Low Risk  (01/21/2023)   Overall Financial Resource Strain (CARDIA)     Difficulty of Paying Living Expenses: Not very hard  Food Insecurity: No Food Insecurity (01/21/2023)   Hunger Vital Sign    Worried About Running Out of Food in the Last Year: Never true    Ran Out of Food in the Last Year: Never true  Transportation Needs: No Transportation Needs (01/21/2023)   PRAPARE - Administrator, Civil Service (Medical): No    Lack of Transportation (Non-Medical): No  Physical Activity: Unknown (01/21/2023)   Exercise Vital Sign    Days of Exercise per Week: 0 days    Minutes of Exercise per Session: Not on file  Stress: Stress Concern Present (01/21/2023)   [employer] of Occupational Health - Occupational Stress Questionnaire    Feeling of Stress : To some extent  Social Connections: Moderately Isolated (01/21/2023)   Social Connection and Isolation Panel [NHANES]    Frequency of Communication with Friends and Family: Twice a week    Frequency of Social Gatherings with Friends and Family: Once a week    Attends Religious Services: Never    Database administrator or Organizations: No    Attends Engineer, structural: Not on file    Marital Status: Living with partner  Intimate Partner Violence: Not on file    Family History  Problem Relation Age of Onset   Obesity Mother    Hypertension Mother    Anxiety disorder Mother    Arthritis Mother    Asthma Mother    Diabetes Mother    Miscarriages / [place] Mother    Obesity Father    Alcohol abuse Father    Arthritis Father    Diabetes Father    Kidney disease Father    Stroke Father    Cancer Maternal Grandmother    Varicose Veins Sister    Varicose Veins Sister    Thyroid disease Neg Hx     Past Surgical History:  Procedure Laterality Date   CLEFT PALATE REPAIR     TYMPANOSTOMY TUBE PLACEMENT      ROS: Review of Systems  HENT:  Negative for hearing loss and trouble swallowing.   Eyes:        Wears contacts.  No blurred vision.  Respiratory:  Negative for  cough and shortness of breath.   Cardiovascular:  Negative for chest pain.  Genitourinary:        Cycles are regular   Negative except as stated above  PHYSICAL EXAM: BP 125/77 (BP Location: Left Arm, Patient Position: Sitting, Cuff Size: Normal)   Pulse 79   Temp 98.4 F (36.9 C) (Oral)   Ht 5\' 6"  (1.676 m)  Wt 289 lb 3.2 oz (131.2 kg)   SpO2 98%   BMI 46.68 kg/m   Wt Readings from Last 3 Encounters:  01/21/23 289 lb 3.2 oz (131.2 kg)  07/18/20 259 lb 14.8 oz (117.9 kg)  10/12/19 260 lb (117.9 kg)   Physical Exam  General appearance - alert, well appearing, young female in NAD  Mental status -patient is tearful at times when talking about her parents and feeling overwhelmed Eyes - pupils equal and reactive, extraocular eye movements intact Ears - bilateral TM's and external ear canals normal Nose - normal and patent, no erythema, discharge or polyps Mouth - mucous membranes moist, pharynx normal without lesions Neck - supple, no significant adenopathy Lymphatics - no palpable lymphadenopathy, no hepatosplenomegaly Chest - clear to auscultation, no wheezes, rales or rhonchi, symmetric air entry Heart - normal rate, regular rhythm, normal S1, S2, no murmurs, rubs, clicks or gallops Abdomen - soft, nontender, nondistended, no masses or organomegaly Neurological - cranial nerves II through XII intact, motor and sensory grossly normal bilaterally Extremities - peripheral pulses normal, no pedal edema, no clubbing or cyanosis    01/21/2023    1:32 PM 09/16/2018    8:34 AM 09/08/2018    3:56 PM  Depression screen PHQ 2/9  Decreased Interest 1 0 0  Down, Depressed, Hopeless 2 0 0  PHQ - 2 Score 3 0 0  Altered sleeping 2    Tired, decreased energy 1    Change in appetite 0    Feeling bad or failure about yourself  1    Trouble concentrating 0    Moving slowly or fidgety/restless 0    Suicidal thoughts 0    PHQ-9 Score 7    Difficult doing work/chores Not difficult at all         01/21/2023    1:32 PM 01/22/2017    3:37 PM  GAD 7 : Generalized Anxiety Score  Nervous, Anxious, on Edge 1 2  Control/stop worrying 1 1  Worry too much - different things 1 2  Trouble relaxing 1 2  Restless 0 0  Easily annoyed or irritable 0 1  Afraid - awful might happen 1 0  Total GAD 7 Score 5 8  Anxiety Difficulty Not difficult at all Somewhat difficult         Latest Ref Rng & Units 10/10/2019    5:37 PM 09/08/2018    5:08 PM 10/16/2017   10:52 AM  CMP  Glucose 70 - 99 mg/dL 99  88  98   BUN 6 - 20 mg/dL 9  9  11    Creatinine 0.44 - 1.00 mg/dL 2.13  0.86  5.78   Sodium 135 - 145 mmol/L 136  138  138   Potassium 3.5 - 5.1 mmol/L 4.0  4.1  4.4   Chloride 98 - 111 mmol/L 100  102  103   CO2 22 - 32 mmol/L 24  20  27    Calcium 8.9 - 10.3 mg/dL 9.7  9.5  9.7   Total Protein 6.5 - 8.1 g/dL 8.7   7.9   Total Bilirubin 0.3 - 1.2 mg/dL 0.3   0.3   Alkaline Phos 38 - 126 U/L 28     AST 15 - 41 U/L 44   17   ALT 0 - 44 U/L 34   11    Lipid Panel  No results found for: "CHOL", "TRIG", "HDL", "CHOLHDL", "VLDL", "LDLCALC", "LDLDIRECT"  CBC    Component Value Date/Time  WBC 7.0 07/18/2020 1819   RBC 4.34 07/18/2020 1819   HGB 13.1 07/18/2020 1819   HCT 38.8 07/18/2020 1819   PLT 268 07/18/2020 1819   MCV 89.4 07/18/2020 1819   MCH 30.2 07/18/2020 1819   MCHC 33.8 07/18/2020 1819   RDW 12.3 07/18/2020 1819   LYMPHSABS 0.7 07/18/2020 1819   MONOABS 0.7 07/18/2020 1819   EOSABS 0.0 07/18/2020 1819   BASOSABS 0.0 07/18/2020 1819    ASSESSMENT AND PLAN: 1. Annual physical exam  2. Morbid obesity due to excess calories (HCC) Patient advised to eliminate sugary drinks from the diet, cut back on portion sizes especially of white carbohydrates, eat more white lean meat like chicken [place] and seafood instead of beef or pork and incorporate fresh fruits and vegetables into the diet daily. -Discussed ways she can get an exercise during her routine daily activities.  She  works in [employer] and gets an hour for lunch.  Encouraged her to consider eating her lunch the first 30 minutes and then walking several times around the block for the second 30 minutes of her lunch hour. - CBC - Comprehensive metabolic panel - Lipid panel - escitalopram (LEXAPRO) 5 MG tablet; Take 1 tablet (5 mg total) by mouth daily.  Dispense: 30 tablet; Refill: 1  3. Major depressive disorder, single episode, mild (HCC) Patient feels she would benefit from some counseling.  She is a Emergency planning/management officer.  I have informed her of the EAP program where she can get 5 or 6 counseling sessions for free.  She will look into this.  We also discussed whether she feels she would benefit from medication.  She is willing to try Lexapro again.  Advised that the medication can take about 4 weeks before she starts to feel better.  We will start on a low-dose for the first 4 weeks and then increase to 10 mg if she is taking and tolerating it.  Advised to stop the medicine and let me know if she develops any suicidal ideation. - escitalopram (LEXAPRO) 5 MG tablet; Take 1 tablet (5 mg total) by mouth daily.  Dispense: 30 tablet; Refill: 1  4. GAD (generalized anxiety disorder) See #4 above.  5. Prediabetes See #2 above. - POCT glycosylated hemoglobin (Hb A1C) - POCT glucose (manual entry)  6. Mild intermittent asthma without complication - albuterol (VENTOLIN HFA) 108 (90 Base) MCG/ACT inhaler; Inhale 1-2 puffs into the lungs every 6 (six) hours as needed for wheezing or shortness of breath.  Dispense: 8 g; Refill: 4 - albuterol (PROVENTIL) (2.5 MG/3ML) 0.083% nebulizer solution; Take 3 mLs (2.5 mg total) by nebulization every 6 (six) hours as needed for wheezing.  Dispense: 500 mL; Refill: 0 - fluticasone (FLONASE) 50 MCG/ACT nasal spray; Place 2 sprays into both nostrils daily.  Dispense: 16 g; Refill: 6 - levocetirizine (XYZAL) 5 MG tablet; Take 1 tablet (5 mg total) by mouth every evening.  Dispense:  90 tablet; Refill: 1  7. Acquired hypothyroidism We will recheck TSH today.  She has been off medicine for over 3 years. - TSH  8. Need for Tdap vaccination Tdapt rxn given to take down stairs to our pharmacy. - Tdap (ADACEL) 07-04-13.5 LF-MCG/0.5 injection; Inject 0.5 mLs into the muscle once for 1 dose.   Dispense: 0.5 mL; Refill: 0  9. Screening for hepatitis C declined   Patient was given the opportunity to ask questions.  Patient verbalized understanding of the plan and was able to repeat key elements  of the plan.   This documentation was completed using Paediatric nurse.  Any transcriptional errors are unintentional.  Orders Placed This Encounter  Procedures   CBC   Comprehensive metabolic panel   Lipid panel   TSH   POCT glycosylated hemoglobin (Hb A1C)   POCT glucose (manual entry)     Requested Prescriptions   Signed Prescriptions Disp Refills   albuterol (VENTOLIN HFA) 108 (90 Base) MCG/ACT inhaler 8 g 4    Sig: Inhale 1-2 puffs into the lungs every 6 (six) hours as needed for wheezing or shortness of breath.   albuterol (PROVENTIL) (2.5 MG/3ML) 0.083% nebulizer solution 500 mL 0    Sig: Take 3 mLs (2.5 mg total) by nebulization every 6 (six) hours as needed for wheezing.   fluticasone (FLONASE) 50 MCG/ACT nasal spray 16 g 6    Sig: Place 2 sprays into both nostrils daily.   levocetirizine (XYZAL) 5 MG tablet 90 tablet 1    Sig: Take 1 tablet (5 mg total) by mouth every evening.   escitalopram (LEXAPRO) 5 MG tablet 30 tablet 1    Sig: Take 1 tablet (5 mg total) by mouth daily.   Tdap (ADACEL) 07-04-13.5 LF-MCG/0.5 injection 0.5 mL 0    Sig: Inject 0.5 mLs into the muscle once for 1 dose.    No follow-ups on file.  Jonah Blue, MD, FACP

## 2023-01-22 LAB — CBC
Hematocrit: 39 % (ref 34.0–46.6)
Hemoglobin: 12.7 g/dL (ref 11.1–15.9)
MCH: 29.2 pg (ref 26.6–33.0)
MCHC: 32.6 g/dL (ref 31.5–35.7)
MCV: 90 fL (ref 79–97)
Platelets: 345 10*3/uL (ref 150–450)
RBC: 4.35 x10E6/uL (ref 3.77–5.28)
RDW: 12.2 % (ref 11.7–15.4)
WBC: 5.6 10*3/uL (ref 3.4–10.8)

## 2023-01-22 LAB — COMPREHENSIVE METABOLIC PANEL
ALT: 13 [IU]/L (ref 0–32)
AST: 21 [IU]/L (ref 0–40)
Albumin: 4.3 g/dL (ref 4.0–5.0)
Alkaline Phosphatase: 44 [IU]/L (ref 44–121)
BUN/Creatinine Ratio: 12 (ref 9–23)
BUN: 10 mg/dL (ref 6–20)
Bilirubin Total: <0.2 mg/dL (ref 0.0–1.2)
CO2: 21 mmol/L (ref 20–29)
Calcium: 9.6 mg/dL (ref 8.7–10.2)
Chloride: 105 mmol/L (ref 96–106)
Creatinine, Ser: 0.86 mg/dL (ref 0.57–1.00)
Globulin, Total: 3.3 g/dL (ref 1.5–4.5)
Glucose: 99 mg/dL (ref 70–99)
Potassium: 4.5 mmol/L (ref 3.5–5.2)
Sodium: 139 mmol/L (ref 134–144)
Total Protein: 7.6 g/dL (ref 6.0–8.5)
eGFR: 93 mL/min/{1.73_m2} (ref >59)

## 2023-01-22 LAB — LIPID PANEL
Chol/HDL Ratio: 3.5 ratio (ref 0.0–4.4)
Cholesterol, Total: 163 mg/dL (ref 100–199)
HDL: 47 mg/dL (ref >39)
LDL Chol Calc (NIH): 105 mg/dL — ABNORMAL HIGH (ref 0–99)
Triglycerides: 56 mg/dL (ref 0–149)
VLDL Cholesterol Cal: 11 mg/dL (ref 5–40)

## 2023-01-22 LAB — TSH: TSH: 1.48 u[IU]/mL (ref 0.450–4.500)

## 2023-01-26 ENCOUNTER — Encounter: Payer: Self-pay | Admitting: Internal Medicine

## 2023-03-25 ENCOUNTER — Ambulatory Visit
Admission: EM | Admit: 2023-03-25 | Discharge: 2023-03-25 | Disposition: A | Discharge status: Home / Self Care | Payer: Commercial Managed Care - PPO

## 2023-03-25 ENCOUNTER — Encounter: Payer: Self-pay | Admitting: Emergency Medicine

## 2023-03-25 ENCOUNTER — Ambulatory Visit: Payer: 59 | Admitting: Internal Medicine

## 2023-03-25 DIAGNOSIS — Z309 Encounter for contraceptive management, unspecified: Secondary | ICD-10-CM

## 2023-03-25 DIAGNOSIS — J209 Acute bronchitis, unspecified: Secondary | ICD-10-CM

## 2023-03-25 DIAGNOSIS — N920 Excessive and frequent menstruation with regular cycle: Secondary | ICD-10-CM | POA: Insufficient documentation

## 2023-03-25 DIAGNOSIS — G8929 Other chronic pain: Secondary | ICD-10-CM | POA: Insufficient documentation

## 2023-03-25 DIAGNOSIS — J4541 Moderate persistent asthma with (acute) exacerbation: Secondary | ICD-10-CM | POA: Diagnosis not present

## 2023-03-25 DIAGNOSIS — E119 Type 2 diabetes mellitus without complications: Secondary | ICD-10-CM | POA: Insufficient documentation

## 2023-03-25 DIAGNOSIS — R102 Pelvic and perineal pain unspecified side: Secondary | ICD-10-CM | POA: Insufficient documentation

## 2023-03-25 DIAGNOSIS — E079 Disorder of thyroid, unspecified: Secondary | ICD-10-CM | POA: Insufficient documentation

## 2023-03-25 DIAGNOSIS — G43909 Migraine, unspecified, not intractable, without status migrainosus: Secondary | ICD-10-CM | POA: Insufficient documentation

## 2023-03-25 DIAGNOSIS — N926 Irregular menstruation, unspecified: Secondary | ICD-10-CM | POA: Insufficient documentation

## 2023-03-25 HISTORY — DX: Encounter for contraceptive management, unspecified: Z30.9

## 2023-03-25 MED ORDER — METHYLPREDNISOLONE 4 MG PO TBPK: ORAL_TABLET | ORAL | 0 refills | Status: DC

## 2023-03-25 MED ORDER — IPRATROPIUM-ALBUTEROL 0.5-2.5 (3) MG/3ML IN SOLN
3.0000 mL | Freq: Once | RESPIRATORY_TRACT | Status: AC
  Administered 2023-03-25: 3 mL via RESPIRATORY_TRACT

## 2023-03-25 MED ORDER — FLUCONAZOLE 150 MG PO TABS: 150.0000 mg | ORAL_TABLET | Freq: Every day | ORAL | 0 refills | Status: DC

## 2023-03-25 MED ORDER — DOXYCYCLINE HYCLATE 100 MG PO CAPS: 100.0000 mg | ORAL_CAPSULE | Freq: Two times a day (BID) | ORAL | 0 refills | Status: DC

## 2023-03-25 MED ORDER — HYDROCODONE BIT-HOMATROP MBR 5-1.5 MG/5ML PO SOLN: 5.0000 mL | Freq: Four times a day (QID) | ORAL | 0 refills | Status: DC | PRN

## 2023-03-25 NOTE — ED Provider Notes (Signed)
[facility]    CSN: [id number] Arrival date & time: 03/25/23  1712      History   Chief Complaint Chief Complaint  Patient presents with   Cough    HPI Ashlee Herman is a 33 y.o. female who presents with worsening cough in the past few days with burning sensation in her chest and back. Started with a cold which she got from her boyfriend who tested negative for Covid and Flu. She has been taking OTC meds including allergy meds but has not helped. Has not used her inhaler today. She denies fever or sweats. She is prone to get secondary infections when she gets this way.      Patient Active Problem List   Diagnosis Date Noted   Disorder of thyroid gland 03/25/2023   Chronic pelvic pain in female 03/25/2023   Contraception management 03/25/2023   Irregular periods 03/25/2023   Menorrhagia 03/25/2023   Migraine 03/25/2023   Pain in pelvis 03/25/2023   Type 2 diabetes mellitus (HCC) 03/25/2023   Acute otitis externa of both ears 10/06/2018   Acute sore throat 10/06/2018   Endometriosis 09/16/2018   Atopic dermatitis 09/11/2018   Herpes simplex 08/14/2017   Dyspareunia in female 07/24/2017   Generalized anxiety disorder 02/06/2017   Mild persistent asthma without complication 03/19/2016   Prediabetes    Obesity    Hypertension    Dyspepsia    Pre-diabetes 06/12/2010    Past Surgical History:  Procedure Laterality Date   CLEFT PALATE REPAIR     TYMPANOSTOMY TUBE PLACEMENT      OB History   No obstetric history on file.      Home Medications    Prior to Admission medications   Medication Sig Start Date End Date Taking? Authorizing Provider  diclofenac (CATAFLAM) 50 MG tablet TAKE 1 TABLET THREE TIMES A DAY AS NEEDED FOR ABDOMINAL PAIN AFTER A MEAL 06/24/19  Yes [provider]  doxycycline (VIBRAMYCIN) 100 MG capsule Take 1 capsule (100 mg total) by mouth 2 (two) times daily. 03/25/23  Yes Rodriguez-Southworth, Nettie Elm, PA-C  fluconazole  (DIFLUCAN) 150 MG tablet Take 1 tablet (150 mg total) by mouth daily. 03/25/23  Yes Rodriguez-Southworth, Nettie Elm, PA-C  methylPREDNISolone (MEDROL DOSEPAK) 4 MG TBPK tablet Take as directed 03/25/23  Yes Rodriguez-Southworth, Nettie Elm, PA-C  albuterol (PROVENTIL) (2.5 MG/3ML) 0.083% nebulizer solution Take 3 mLs (2.5 mg total) by nebulization every 6 (six) hours as needed for wheezing. 01/21/23   Marcine Matar, MD  albuterol (VENTOLIN HFA) 108 (90 Base) MCG/ACT inhaler Inhale 1-2 puffs into the lungs every 6 (six) hours as needed for wheezing or shortness of breath. 01/21/23   Marcine Matar, MD  cyclobenzaprine (FLEXERIL) 10 MG tablet TAKE 1 TABLET BY MOUTH AT BEDTIME AS NEEDED FOR PELVIC PAIN    [provider]  fluticasone (FLONASE) 50 MCG/ACT nasal spray Place 2 sprays into both nostrils daily. 01/21/23   Marcine Matar, MD  HYDROcodone bit-homatropine (HYCODAN) 5-1.5 MG/5ML syrup Take 5 mLs by mouth every 6 (six) hours as needed for cough. 03/25/23   Rodriguez-Southworth, Nettie Elm, PA-C  influenza vac split quadrivalent PF (AFLURIA QUADRIVALENT) 0.5 ML injection Afluria Qd 2020-21 (36 mos up)(PF)60 mcg (15 mcg x4)/0.5 mL IM syringe  PHARMACY ADMINISTERED    [provider]  levocetirizine (XYZAL) 5 MG tablet Take 1 tablet (5 mg total) by mouth every evening. 01/21/23   Marcine Matar, MD  levothyroxine (SYNTHROID) 25 MCG tablet Take 1 tablet by  mouth daily before breakfast. Patient not taking: Reported on 03/25/2023    [provider]  Tdap (BOOSTRIX) 5-2.5-18.5 LF-MCG/0.5 injection Boostrix Tdap 2.5 Lf unit-8 mcg-5 Lf/0.5 mL intramuscular syringe  TO BE ADMINISTERED BY PHARMACIST FOR IMMUNIZATION    [provider]  Tdap (BOOSTRIX) 5-2.5-18.5 LF-MCG/0.5 injection Boostrix Tdap 2.5 Lf unit-8 mcg-5 Lf/0.5 mL intramuscular syringe  TO BE ADMINISTERED BY PHARMACIST FOR IMMUNIZATION    [provider]    Family History Family History  Problem  Relation Age of Onset   Obesity Mother    Hypertension Mother    Anxiety disorder Mother    Arthritis Mother    Asthma Mother    Diabetes Mother    Miscarriages / [place] Mother    Obesity Father    Alcohol abuse Father    Arthritis Father    Diabetes Father    Kidney disease Father    Stroke Father    Cancer Maternal Grandmother    Varicose Veins Sister    Varicose Veins Sister    Thyroid disease Neg Hx     Social History Social History   Tobacco Use   Smoking status: Never   Smokeless tobacco: Never  Vaping Use   Vaping status: Never Used  Substance Use Topics   Alcohol use: Yes    Comment: Occasional drinker   Drug use: No     Allergies   Tramadol and Citrus   Review of Systems Review of Systems As noted in HPI  Physical Exam Triage Vital Signs ED Triage Vitals  Encounter Vitals Group     BP 03/25/23 1851 123/80     Systolic BP Percentile --      Diastolic BP Percentile --      Pulse Rate 03/25/23 1851 72     Resp 03/25/23 1851 16     Temp 03/25/23 1851 98.5 F (36.9 C)     Temp Source 03/25/23 1851 Oral     SpO2 03/25/23 1851 99 %     Weight 03/25/23 1853 286 lb (129.7 kg)     Height 03/25/23 1853 5\' 6"  (1.676 m)     Head Circumference --      Peak Flow --      Pain Score 03/25/23 1853 9     Pain Loc --      Pain Education --      Exclude from Growth Chart --    No data found.  Updated Vital Signs BP 123/80 (BP Location: Left Arm)   Pulse 72   Temp 98.5 F (36.9 C) (Oral)   Resp 16   Ht 5\' 6"  (1.676 m)   Wt 286 lb (129.7 kg)   LMP 02/21/2023 (Exact Date)   SpO2 99%   BMI 46.16 kg/m   Visual Acuity Right Eye Distance:   Left Eye Distance:   Bilateral Distance:    Right Eye Near:   Left Eye Near:    Bilateral Near:     Physical Exam Physical Exam Constitutional:      General: He is not in acute distress.    Appearance: He is not toxic-appearing.  HENT:     Head: Normocephalic.     Right Ear: Tympanic membrane, ear  canal and external ear normal.     Left Ear: Ear canal and external ear normal.     Nose: Nose normal.     Mouth/Throat:     Mouth: Mucous membranes are moist.     Pharynx: Oropharynx is clear.  Eyes:     General: No scleral icterus.    Conjunctiva/sclera: Conjunctivae normal.  Cardiovascular:     Rate and Rhythm: Normal rate and regular rhythm.     Heart sounds: No murmur heard.   Pulmonary:     Effort: Pulmonary effort is normal. No respiratory distress.     Breath sounds: normal lung sounds present. Deep exhalations provoked coughing fits.   Musculoskeletal:        General: Normal range of motion.     Cervical back: Neck supple.  Lymphadenopathy:     Cervical: No cervical adenopathy.  Skin:    General: Skin is warm and dry.     Findings: No rash.  Neurological:     Mental Status: He is alert and oriented to person, place, and time.     Gait: Gait normal.  Psychiatric:        Mood and Affect: Mood normal.        Behavior: Behavior normal.        Thought Content: Thought content normal.        Judgment: Judgment normal.    UC Treatments / Results  Labs (all labs ordered are listed, but only abnormal results are displayed) Labs Reviewed - No data to display  EKG   Radiology No results found.  Procedures Procedures (including critical care time)  Medications Ordered in UC Medications  ipratropium-albuterol (DUONEB) 0.5-2.5 (3) MG/3ML nebulizer solution 3 mL (3 mLs Nebulization Given 03/25/23 1923)    Initial Impression / Assessment and Plan / UC Course  I have reviewed the triage vital signs and the nursing notes. She was given Duoneb treatment   Astham exacerbations Acute bronchitis  I placed her on Doxy, Medrol, Hycodan and Diflucan as noted.     Final Clinical Impressions(s) / UC Diagnoses   Final diagnoses:  Moderate persistent asthma with acute exacerbation  Acute bronchitis, unspecified organism   Discharge Instructions   None    ED  Prescriptions     Medication Sig Dispense Auth. Provider   methylPREDNISolone (MEDROL DOSEPAK) 4 MG TBPK tablet Take as directed 21 tablet Rodriguez-Southworth, Nettie Elm, PA-C   doxycycline (VIBRAMYCIN) 100 MG capsule Take 1 capsule (100 mg total) by mouth 2 (two) times daily. 20 capsule Rodriguez-Southworth, Amiyah Shryock, PA-C   HYDROcodone bit-homatropine (HYCODAN) 5-1.5 MG/5ML syrup  (Status: Discontinued) Take 5 mLs by mouth every 6 (six) hours as needed for cough. 120 mL Rodriguez-Southworth, Lula Michaux, PA-C   HYDROcodone bit-homatropine (HYCODAN) 5-1.5 MG/5ML syrup Take 5 mLs by mouth every 6 (six) hours as needed for cough. 120 mL Rodriguez-Southworth, Estera Ozier, PA-C   fluconazole (DIFLUCAN) 150 MG tablet Take 1 tablet (150 mg total) by mouth daily. 2 tablet Rodriguez-Southworth, Nettie Elm, PA-C      I have reviewed the PDMP during this encounter.   Garey Ham, [place] 03/25/23 1952

## 2023-03-25 NOTE — ED Triage Notes (Signed)
Patient here today with c/o cough, nasal congestion, fatigue, right ear fullness, and burning sensation in chest and back X 2 weeks. She believes that she symptoms seems to be worsening. She has been taking Dayquil, Nyquil, Robitussin, Sudafed, and Zyrtec with some relief of the Nyquil and Robitussin but not relief in the day. Her partner has also been sick but he is better.

## 2023-09-20 ENCOUNTER — Encounter: Payer: Self-pay | Admitting: Advanced Practice Midwife

## 2023-09-20 ENCOUNTER — Telehealth: Payer: Self-pay | Admitting: Internal Medicine

## 2023-09-20 NOTE — Telephone Encounter (Signed)
 Called patient to confirm upcoming appointment 09/24/2023 at 4:10 pm with Dr.Johnson. Patient appointment has been successfully confirmed

## 2023-09-24 ENCOUNTER — Other Ambulatory Visit: Payer: Self-pay

## 2023-09-24 ENCOUNTER — Ambulatory Visit: Attending: Internal Medicine | Admitting: Internal Medicine

## 2023-09-24 VITALS — BP 116/82 | HR 88 | Temp 98.1°F | Ht 66.0 in | Wt 288.0 lb

## 2023-09-24 DIAGNOSIS — N946 Dysmenorrhea, unspecified: Secondary | ICD-10-CM

## 2023-09-24 DIAGNOSIS — F4320 Adjustment disorder, unspecified: Secondary | ICD-10-CM

## 2023-09-24 DIAGNOSIS — F411 Generalized anxiety disorder: Secondary | ICD-10-CM | POA: Diagnosis not present

## 2023-09-24 DIAGNOSIS — Z634 Disappearance and death of family member: Secondary | ICD-10-CM

## 2023-09-24 DIAGNOSIS — F330 Major depressive disorder, recurrent, mild: Secondary | ICD-10-CM

## 2023-09-24 MED ORDER — SERTRALINE HCL 50 MG PO TABS
ORAL_TABLET | ORAL | 1 refills | Status: DC
  Filled 2023-09-24: qty 60, 60d supply, fill #0

## 2023-09-24 MED ORDER — CYCLOBENZAPRINE HCL 10 MG PO TABS
10.0000 mg | ORAL_TABLET | Freq: Every day | ORAL | 1 refills | Status: AC | PRN
  Filled 2023-09-24: qty 30, 30d supply, fill #0

## 2023-09-24 MED ORDER — CLONAZEPAM 0.5 MG PO TABS
0.5000 mg | ORAL_TABLET | Freq: Every day | ORAL | 0 refills | Status: DC | PRN
  Filled 2023-09-24: qty 15, 15d supply, fill #0

## 2023-09-24 NOTE — Patient Instructions (Signed)
 We will try you on a different antidepressant medication called Zoloft .  I have sent the prescription to your pharmacy for the 50 mg tablet.  Take half a tablet daily for the first 3 weeks and then after that a full tablet daily.  I have sent a limited supply on the medication called clonazepam  to use as needed for anxiety as you go through the grieving process.  We will use this for a short period of time of no more than a month.  The medication can be habit-forming.  Please consider using the employee assistance program counseling services as you search to get establish with a permanent counselor.

## 2023-09-24 NOTE — Progress Notes (Unsigned)
 Patient ID: Ashlee Herman, female    DOB: 11-26-1990  MRN: [id number]  CC: Diabetes (DM f/u. /Experiencing pain during menstrual cycle due to endometriosis /Discuss Lexapro  - causing grinding, lockjaw, cracked tooth)   Subjective: Ashlee Herman is a 33 y.o. female who presents for chronic ds management. Her concerns today include:  Patient with history of mild persistent asthma, hypothyroidism, obesity/prediabetes, GAD, endometriosis   Patient presents with 2 main concerns.  She has history of endometriosis which causes dysmenorrhea for her.  She states that she gets significant discomfort/cramps 3 to 4 days before her menstrual cycle begins during the cycle.  Over-the-counter pain medication alone does not help.  Her gynecologist had prescribed some Flexeril  for her to use as needed.  She is requesting a refill on it until she gets back in with her gynecologist.  Patient also expresses significant sadness over the recent death of her mother on the 09-23-2023 of this month.  She was an only child.  She reports good support from extended family.  She is in the process of planning her mother's funeral with her father.  She has history of depression and anxiety.  We had started her on Lexapro  on last visit with me in November of last year.  I also advised that she reach out to the EAP program to get in some counseling sessions. She subsequently sent me a MyChart message several days later stating that the Lexapro  was causing lockjaw or stiffness in her jaw so she d/c taking it.  She never did recheck to a counselor but states that she now plans to do so. She would like to be tried on a different medication to help with the anxiety and depression that has been worsened since the recent death of her mother.  She is having problems sleeping and waking up in the middle of the night with anxiety attacks. Request medication to use as needed to help with this and to help her get through the painful process of  planning the funeral which is scheduled for 10/02/2023. She denies any SI.  Patient Active Problem List   Diagnosis Date Noted   Disorder of thyroid  gland 03/25/2023   Chronic pelvic pain in female 03/25/2023   Contraception management 03/25/2023   Irregular periods 03/25/2023   Menorrhagia 03/25/2023   Migraine 03/25/2023   Pain in pelvis 03/25/2023   Type 2 diabetes mellitus (HCC) 03/25/2023   Acute otitis externa of both ears 10/06/2018   Acute sore throat 10/06/2018   Endometriosis 09/16/2018   Atopic dermatitis 09/11/2018   Herpes simplex 08/14/2017   Dyspareunia in female 07/24/2017   Generalized anxiety disorder 02/06/2017   Mild persistent asthma without complication 03/19/2016   Prediabetes    Obesity    Hypertension    Dyspepsia    Pre-diabetes 06/12/2010     Current Outpatient Medications on File Prior to Visit  Medication Sig Dispense Refill   albuterol  (PROVENTIL ) (2.5 MG/3ML) 0.083% nebulizer solution Take 3 mLs (2.5 mg total) by nebulization every 6 (six) hours as needed for wheezing. 500 mL 0   albuterol  (VENTOLIN  HFA) 108 (90 Base) MCG/ACT inhaler Inhale 1-2 puffs into the lungs every 6 (six) hours as needed for wheezing or shortness of breath. 8 g 4   fluticasone  (FLONASE ) 50 MCG/ACT nasal spray Place 2 sprays into both nostrils daily. 16 g 6   Tdap (BOOSTRIX) 5-2.5-18.5 LF-MCG/0.5 injection Boostrix Tdap 2.5 Lf unit-8 mcg-5 Lf/0.5 mL intramuscular syringe  TO BE ADMINISTERED BY  PHARMACIST FOR IMMUNIZATION (Patient not taking: Reported on 09/24/2023)     Tdap (BOOSTRIX) 5-2.5-18.5 LF-MCG/0.5 injection Boostrix Tdap 2.5 Lf unit-8 mcg-5 Lf/0.5 mL intramuscular syringe  TO BE ADMINISTERED BY PHARMACIST FOR IMMUNIZATION (Patient not taking: Reported on 09/24/2023)     No current facility-administered medications on file prior to visit.    Allergies  Allergen Reactions   Tramadol  Swelling   Citrus Itching and Other (See Comments)   Lexapro  [Escitalopram ]      Jaw stiffness    Social History   Socioeconomic History   Marital status: Single    Spouse name: Not on file   Number of children: Not on file   Years of education: Not on file   Highest education level: Some college, no degree  Occupational History   Not on file  Tobacco Use   Smoking status: Never   Smokeless tobacco: Never  Vaping Use   Vaping status: Never Used  Substance and Sexual Activity   Alcohol use: Yes    Comment: Occasional drinker   Drug use: No   Sexual activity: Yes    Birth control/protection: None  Other Topics Concern   Not on file  Social History Narrative   Not on file   Social Drivers of Health   Financial Resource Strain: Low Risk  (09/24/2023)   Overall Financial Resource Strain (CARDIA)    Difficulty of Paying Living Expenses: Not very hard  Food Insecurity: No Food Insecurity (09/24/2023)   Hunger Vital Sign    Worried About Running Out of Food in the Last Year: Never true    Ran Out of Food in the Last Year: Never true  Transportation Needs: No Transportation Needs (09/24/2023)   PRAPARE - Administrator, Civil Service (Medical): No    Lack of Transportation (Non-Medical): No  Physical Activity: Insufficiently Active (09/24/2023)   Exercise Vital Sign    Days of Exercise per Week: 2 days    Minutes of Exercise per Session: 30 min  Stress: Stress Concern Present (09/24/2023)   [employer] of Occupational Health - Occupational Stress Questionnaire    Feeling of Stress: Very much  Social Connections: Moderately Integrated (09/24/2023)   Social Connection and Isolation Panel    Frequency of Communication with Friends and Family: More than three times a week    Frequency of Social Gatherings with Friends and Family: Twice a week    Attends Religious Services: 1 to 4 times per year    Active Member of [employer] or Organizations: No    Attends Engineer, structural: Not on file    Marital Status: Living with partner  Intimate  Partner Violence: Not At Risk (09/24/2023)   Humiliation, Afraid, Rape, and Kick questionnaire    Fear of Current or Ex-Partner: No    Emotionally Abused: No    Physically Abused: No    Sexually Abused: No    Family History  Problem Relation Age of Onset   Obesity Mother    Hypertension Mother    Anxiety disorder Mother    Arthritis Mother    Asthma Mother    Diabetes Mother    Miscarriages / [place] Mother    Obesity Father    Alcohol abuse Father    Arthritis Father    Diabetes Father    Kidney disease Father    Stroke Father    Cancer Maternal Grandmother    Varicose Veins Sister    Varicose Veins Sister    Thyroid  disease  Neg Hx     Past Surgical History:  Procedure Laterality Date   CLEFT PALATE REPAIR     TYMPANOSTOMY TUBE PLACEMENT      ROS: Review of Systems Negative except as stated above  PHYSICAL EXAM: BP 116/82 (BP Location: Left Arm, Patient Position: Sitting, Cuff Size: Large)   Pulse 88   Temp 98.1 F (36.7 C) (Oral)   Ht 5' 6 (1.676 m)   Wt 288 lb (130.6 kg)   SpO2 99%   BMI 46.48 kg/m   Physical Exam  General appearance - alert, well appearing, obese young Hispanic female and in no distress Mental status -patient is intermittently tearful.     09/24/2023    4:36 PM 01/21/2023    1:32 PM 09/16/2018    8:34 AM  Depression screen PHQ 2/9  Decreased Interest 2 1 0  Down, Depressed, Hopeless 3 2 0  PHQ - 2 Score 5 3 0  Altered sleeping 2 2   Tired, decreased energy 2 1   Change in appetite 0 0   Feeling bad or failure about yourself  0 1   Trouble concentrating 0 0   Moving slowly or fidgety/restless 0 0   Suicidal thoughts 0 0   PHQ-9 Score 9 7   Difficult doing work/chores Very difficult Not difficult at all         Latest Ref Rng & Units 01/21/2023   10:02 AM 10/10/2019    5:37 PM 09/08/2018    5:08 PM  CMP  Glucose 70 - 99 mg/dL 99  99  88   BUN 6 - 20 mg/dL 10  9  9    Creatinine 0.57 - 1.00 mg/dL 9.13  9.14  9.08    Sodium 134 - 144 mmol/L 139  136  138   Potassium 3.5 - 5.2 mmol/L 4.5  4.0  4.1   Chloride 96 - 106 mmol/L 105  100  102   CO2 20 - 29 mmol/L 21  24  20    Calcium 8.7 - 10.2 mg/dL 9.6  9.7  9.5   Total Protein 6.0 - 8.5 g/dL 7.6  8.7    Total Bilirubin 0.0 - 1.2 mg/dL <9.7  0.3    Alkaline Phos 44 - 121 IU/L 44  28    AST 0 - 40 IU/L 21  44    ALT 0 - 32 IU/L 13  34     Lipid Panel     Component Value Date/Time   CHOL 163 01/21/2023 1002   TRIG 56 01/21/2023 1002   HDL 47 01/21/2023 1002   CHOLHDL 3.5 01/21/2023 1002   LDLCALC 105 (H) 01/21/2023 1002    CBC    Component Value Date/Time   WBC 5.6 01/21/2023 1002   WBC 7.0 07/18/2020 1819   RBC 4.35 01/21/2023 1002   RBC 4.34 07/18/2020 1819   HGB 12.7 01/21/2023 1002   HCT 39.0 01/21/2023 1002   PLT 345 01/21/2023 1002   MCV 90 01/21/2023 1002   MCH 29.2 01/21/2023 1002   MCH 30.2 07/18/2020 1819   MCHC 32.6 01/21/2023 1002   MCHC 33.8 07/18/2020 1819   RDW 12.2 01/21/2023 1002   LYMPHSABS 0.7 07/18/2020 1819   MONOABS 0.7 07/18/2020 1819   EOSABS 0.0 07/18/2020 1819   BASOSABS 0.0 07/18/2020 1819    ASSESSMENT AND PLAN: 1. Bereavement reaction (Primary) 2. GAD (generalized anxiety disorder) 3. Major depressive disorder, recurrent episode, mild (HCC) Patient with underlying depression and anxiety presenting  with worsening of symptoms due to the recent death of her mother.  She would like to be placed on medication.  Did not tolerate Lexapro  last fall. -Strongly advised that she gets in with a counselor.  She states that she will touch base with the EAP program this wk for some grief counseling.  She is agreeable to trying a different medication.  We will try her with Zoloft  starting at 25 mg daily for 3 weeks then increasing to 50 mg daily.  Advised to stop the medicine if she develops any significant side effects or worsening depression/anxiety. We discussed putting her on a low-dose of clonazepam  short-term to  help her get through the next 2 weeks.  Advised that it is a controlled substance, can cause drowsiness and can become habit-forming when used long-term. - sertraline  (ZOLOFT ) 50 MG tablet; Take 1/2 tablet by mouth daily x 3 weeks then 1 tablet daily there after.  Dispense: 60 tablet; Refill: 1 - clonazePAM  (KLONOPIN ) 0.5 MG tablet; Take 1 tablet (0.5 mg total) by mouth daily as needed for anxiety.  Dispense: 15 tablet; Refill: 0  4. Dysmenorrhea Refill given on Flexeril  to use as needed.  Advised to take with ibuprofen  800 mg.  Advised that the medication can cause drowsiness.  Be cautious with use in combination with the clonazepam . - cyclobenzaprine  (FLEXERIL ) 10 MG tablet; Take 1 tablet (10 mg total) by mouth daily as needed for muscle spasms.  Dispense: 30 tablet; Refill: 1    Patient was given the opportunity to ask questions.  Patient verbalized understanding of the plan and was able to repeat key elements of the plan.   This documentation was completed using Paediatric nurse.  Any transcriptional errors are unintentional.  No orders of the defined types were placed in this encounter.    Requested Prescriptions   Signed Prescriptions Disp Refills   cyclobenzaprine  (FLEXERIL ) 10 MG tablet 30 tablet 1    Sig: Take 1 tablet (10 mg total) by mouth daily as needed for muscle spasms.   sertraline  (ZOLOFT ) 50 MG tablet 60 tablet 1    Sig: Take 1/2 tablet by mouth daily x 3 weeks then 1 tablet daily there after.   clonazePAM  (KLONOPIN ) 0.5 MG tablet 15 tablet 0    Sig: Take 1 tablet (0.5 mg total) by mouth daily as needed for anxiety.    Return in about 2 months (around 11/25/2023).  Barnie Louder, MD, FACP

## 2023-09-25 ENCOUNTER — Encounter: Payer: Self-pay | Admitting: Internal Medicine

## 2023-11-22 ENCOUNTER — Telehealth: Payer: Self-pay | Admitting: Internal Medicine

## 2023-11-22 NOTE — Telephone Encounter (Signed)
 Lvm to confirm appt for 9/22

## 2023-11-25 ENCOUNTER — Other Ambulatory Visit: Payer: Self-pay

## 2023-11-25 ENCOUNTER — Encounter: Payer: Self-pay | Admitting: Internal Medicine

## 2023-11-25 ENCOUNTER — Ambulatory Visit: Attending: Internal Medicine | Admitting: Internal Medicine

## 2023-11-25 VITALS — BP 122/79 | HR 76 | Temp 98.1°F | Ht 66.0 in | Wt 285.0 lb

## 2023-11-25 DIAGNOSIS — F411 Generalized anxiety disorder: Secondary | ICD-10-CM | POA: Diagnosis not present

## 2023-11-25 DIAGNOSIS — R002 Palpitations: Secondary | ICD-10-CM

## 2023-11-25 DIAGNOSIS — F330 Major depressive disorder, recurrent, mild: Secondary | ICD-10-CM | POA: Diagnosis not present

## 2023-11-25 DIAGNOSIS — Z87898 Personal history of other specified conditions: Secondary | ICD-10-CM | POA: Diagnosis not present

## 2023-11-25 DIAGNOSIS — Z634 Disappearance and death of family member: Secondary | ICD-10-CM

## 2023-11-25 DIAGNOSIS — Z8639 Personal history of other endocrine, nutritional and metabolic disease: Secondary | ICD-10-CM

## 2023-11-25 DIAGNOSIS — F4320 Adjustment disorder, unspecified: Secondary | ICD-10-CM

## 2023-11-25 MED ORDER — SERTRALINE HCL 50 MG PO TABS
50.0000 mg | ORAL_TABLET | Freq: Every day | ORAL | 1 refills | Status: DC
  Filled 2023-11-25: qty 60, 60d supply, fill #0
  Filled 2024-01-22: qty 60, 60d supply, fill #1

## 2023-11-25 NOTE — Patient Instructions (Signed)
 VISIT SUMMARY:  During today's visit, we discussed your ongoing grief and mental health, as well as your chronic medical conditions. We reviewed your current medications, recent lab results, and lifestyle changes. We also talked about your plans to incorporate more physical activity into your routine.  YOUR PLAN:  -DEPRESSION AND GENERALIZED ANXIETY DISORDER: You are currently managing your depression and anxiety with sertraline  (Zoloft ) 50 mg daily, which has been effective in reducing your overthinking. However, missing a dose or taking it at a different time increases your anxiety and causes tingling sensations. You are seeking in-person grief counseling and we will submit a referral for therapy sessions.  -PALPITATIONS: You experience heart flutters about twice a week, lasting around a minute, which are not linked to anxiety. We advise you to monitor the frequency and pattern of these flutters, especially given your family history of heart disease.  -HYPOTHYROIDISM: Hypothyroidism means your thyroid  gland is underactive. You are currently not on thyroid  medication but are experiencing symptoms like feeling hot and heart flutters. We will order thyroid  function tests to check your thyroid  levels.  -ASTHMA: Your asthma is well-managed with no recent flares, although you experience symptoms when you are emotional. Continue to monitor your symptoms and avoid known triggers.  -PREDIABETES: Your recent A1c of 5.6% and blood sugar of 105 mg/dL indicate that you are no longer in the prediabetes range. You have made positive dietary changes and are not taking metformin . Continue with your healthy eating habits to maintain your health.  -GENERAL HEALTH MAINTENANCE: We discussed the importance of regular physical activity. You are encouraged to incorporate more exercise into your routine, such as walking your dog regularly.  INSTRUCTIONS:  We will submit a referral for in-person therapy sessions for  your grief counseling. Please monitor the frequency and pattern of your heart flutters and report any changes. We will also order thyroid  function tests to check your thyroid  levels. Continue with your healthy eating habits and try to incorporate more physical activity into your routine.

## 2023-11-25 NOTE — Progress Notes (Unsigned)
 Patient ID: Ashlee Herman, female    DOB: 1990/12/21  MRN: [id number]  CC: Diabetes (DM f/u. Med refills. /Requesting thyroid  lab work /No to flu vax - will get at work. Instructed to sign ROI for pap.)   Subjective: Ashlee Herman is a 33 y.o. female who presents for chronic ds management. Her concerns today include:  Patient with history of mild persistent asthma, hypothyroidism, obesity/prediabetes, GAD, endometriosis   Discussed the use of AI scribe software for clinical note transcription with the patient, who gave verbal consent to proceed.  History of Present Illness Ashlee Herman is a 33 year old female who presents for follow-up on her chronic medical issues and mental health.  She is experiencing ongoing grief from the loss of her mother and is seeking a Camera operator. She has been utilizing resources provided by her boyfriend's sister, who is a therapist, and has applied for a grant to help pay for therapy sessions. She previously tried the EAP but prefers in-person counseling over virtual visits. She is currently taking Zoloft  50 mg daily and notes that missing a dose or taking it at a different time increases her anxiety and causes 'zaps' or tingling sensations. She feels the medication is effective in reducing her overthinking. Still has some Clonazepam  left; wonders about getting RF to keep on hand to use PRN. Has found it helpful as well.  Hx PreDM/Obesity: Her recent A1c is 5.6, and her blood sugar is 105. She has been off metformin  for a long time. She has been focusing on improving her eating habits by preparing meals at home, such as overnight oats and baked foods, and avoiding fried foods. She lives with her boyfriend and has been more health-conscious since her mother's passing. Her weight has remained stable since January. She is not currently exercising but plans to gradually incorporate it into her routine. She walks her dog in the backyard. She finds managing her  diet to be the most challenging aspect of her health.  Asthma: Her asthma has not flared recently, except when she is crying. She has not experienced issues with the weather change.  She has a past history of hypothyroidism but was able to be taken off med almost 4 yrs ago. She reports feeling hot all the time and experiences heart flutters about twice a week, often after meals. These flutters last about a minute and are not associated with anxiety. She is monitoring these symptoms. Request thyroid  level check.    Patient Active Problem List   Diagnosis Date Noted   Disorder of thyroid  gland 03/25/2023   Chronic pelvic pain in female 03/25/2023   Contraception management 03/25/2023   Irregular periods 03/25/2023   Menorrhagia 03/25/2023   Migraine 03/25/2023   Pain in pelvis 03/25/2023   Type 2 diabetes mellitus (HCC) 03/25/2023   Acute otitis externa of both ears 10/06/2018   Acute sore throat 10/06/2018   Endometriosis 09/16/2018   Atopic dermatitis 09/11/2018   Herpes simplex 08/14/2017   Dyspareunia in female 07/24/2017   Generalized anxiety disorder 02/06/2017   Mild persistent asthma without complication 03/19/2016   Prediabetes    Obesity    Hypertension    Dyspepsia    Pre-diabetes 06/12/2010     Current Outpatient Medications on File Prior to Visit  Medication Sig Dispense Refill   albuterol  (PROVENTIL ) (2.5 MG/3ML) 0.083% nebulizer solution Take 3 mLs (2.5 mg total) by nebulization every 6 (six) hours as needed for wheezing. 500 mL 0  albuterol  (VENTOLIN  HFA) 108 (90 Base) MCG/ACT inhaler Inhale 1-2 puffs into the lungs every 6 (six) hours as needed for wheezing or shortness of breath. 8 g 4   clonazePAM  (KLONOPIN ) 0.5 MG tablet Take 1 tablet (0.5 mg total) by mouth daily as needed for anxiety. 15 tablet 0   cyclobenzaprine  (FLEXERIL ) 10 MG tablet Take 1 tablet (10 mg total) by mouth daily as needed for muscle spasms. 30 tablet 1   fluticasone  (FLONASE ) 50 MCG/ACT  nasal spray Place 2 sprays into both nostrils daily. 16 g 6   No current facility-administered medications on file prior to visit.    Allergies  Allergen Reactions   Tramadol  Swelling   Citrus Itching and Other (See Comments)   Lexapro  [Escitalopram ]     Jaw stiffness    Social History   Socioeconomic History   Marital status: Single    Spouse name: Not on file   Number of children: Not on file   Years of education: Not on file   Highest education level: Some college, no degree  Occupational History   Not on file  Tobacco Use   Smoking status: Never   Smokeless tobacco: Never  Vaping Use   Vaping status: Never Used  Substance and Sexual Activity   Alcohol use: Yes    Comment: Occasional drinker   Drug use: No   Sexual activity: Yes    Birth control/protection: None  Other Topics Concern   Not on file  Social History Narrative   Not on file   Social Drivers of Health   Financial Resource Strain: Low Risk  (09/24/2023)   Overall Financial Resource Strain (CARDIA)    Difficulty of Paying Living Expenses: Not very hard  Food Insecurity: No Food Insecurity (09/24/2023)   Hunger Vital Sign    Worried About Running Out of Food in the Last Year: Never true    Ran Out of Food in the Last Year: Never true  Transportation Needs: No Transportation Needs (09/24/2023)   PRAPARE - Administrator, Civil Service (Medical): No    Lack of Transportation (Non-Medical): No  Physical Activity: Insufficiently Active (09/24/2023)   Exercise Vital Sign    Days of Exercise per Week: 2 days    Minutes of Exercise per Session: 30 min  Stress: Stress Concern Present (09/24/2023)   [employer] of Occupational Health - Occupational Stress Questionnaire    Feeling of Stress: Very much  Social Connections: Moderately Integrated (09/24/2023)   Social Connection and Isolation Panel    Frequency of Communication with Friends and Family: More than three times a week    Frequency  of Social Gatherings with Friends and Family: Twice a week    Attends Religious Services: 1 to 4 times per year    Active Member of [employer] or Organizations: No    Attends Banker Meetings: Not on file    Marital Status: Living with partner  Intimate Partner Violence: Not At Risk (09/24/2023)   Humiliation, Afraid, Rape, and Kick questionnaire    Fear of Current or Ex-Partner: No    Emotionally Abused: No    Physically Abused: No    Sexually Abused: No    Family History  Problem Relation Age of Onset   Obesity Mother    Hypertension Mother    Anxiety disorder Mother    Arthritis Mother    Asthma Mother    Diabetes Mother    Miscarriages / [place] Mother    Obesity Father  Alcohol abuse Father    Arthritis Father    Diabetes Father    Kidney disease Father    Stroke Father    Cancer Maternal Grandmother    Varicose Veins Sister    Varicose Veins Sister    Thyroid  disease Neg Hx     Past Surgical History:  Procedure Laterality Date   CLEFT PALATE REPAIR     TYMPANOSTOMY TUBE PLACEMENT      ROS: Review of Systems Negative except as stated above  PHYSICAL EXAM: BP 122/79 (BP Location: Left Arm, Patient Position: Sitting, Cuff Size: Large)   Pulse 76   Temp 98.1 F (36.7 C) (Oral)   Ht 5' 6 (1.676 m)   Wt 285 lb (129.3 kg)   SpO2 99%   BMI 46.00 kg/m   Wt Readings from Last 3 Encounters:  11/25/23 285 lb (129.3 kg)  09/24/23 288 lb (130.6 kg)  03/25/23 286 lb (129.7 kg)    Physical Exam  General appearance - alert, well appearing, obese young female and in no distress Mental status - normal mood, behavior, speech, dress, motor activity, and thought processes Neck - supple, no significant adenopathy Chest - clear to auscultation, no wheezes, rales or rhonchi, symmetric air entry Heart - normal rate, regular rhythm, normal S1, S2, no murmurs, rubs, clicks or gallops Extremities - peripheral pulses normal, no LE edema  Lab Results   Component Value Date   HGBA1C 5.7 01/21/2023       Latest Ref Rng & Units 11/25/2023    4:36 PM 01/21/2023   10:02 AM 10/10/2019    5:37 PM  CMP  Glucose 70 - 99 mg/dL 88  99  99   BUN 6 - 20 mg/dL 10  10  9    Creatinine 0.57 - 1.00 mg/dL 9.00  9.13  9.14   Sodium 134 - 144 mmol/L 139  139  136   Potassium 3.5 - 5.2 mmol/L 3.7  4.5  4.0   Chloride 96 - 106 mmol/L 103  105  100   CO2 20 - 29 mmol/L 21  21  24    Calcium 8.7 - 10.2 mg/dL 9.9  9.6  9.7   Total Protein 6.0 - 8.5 g/dL 7.9  7.6  8.7   Total Bilirubin 0.0 - 1.2 mg/dL <9.7  <9.7  0.3   Alkaline Phos 41 - 116 IU/L 52  44  28   AST 0 - 40 IU/L 21  21  44   ALT 0 - 32 IU/L 14  13  34    Lipid Panel     Component Value Date/Time   CHOL 174 11/25/2023 1636   TRIG 144 11/25/2023 1636   HDL 51 11/25/2023 1636   CHOLHDL 3.4 11/25/2023 1636   LDLCALC 98 11/25/2023 1636    CBC    Component Value Date/Time   WBC 8.1 11/25/2023 1636   WBC 7.0 07/18/2020 1819   RBC 4.20 11/25/2023 1636   RBC 4.34 07/18/2020 1819   HGB 12.2 11/25/2023 1636   HCT 38.1 11/25/2023 1636   PLT 377 11/25/2023 1636   MCV 91 11/25/2023 1636   MCH 29.0 11/25/2023 1636   MCH 30.2 07/18/2020 1819   MCHC 32.0 11/25/2023 1636   MCHC 33.8 07/18/2020 1819   RDW 12.9 11/25/2023 1636   LYMPHSABS 0.7 07/18/2020 1819   MONOABS 0.7 07/18/2020 1819   EOSABS 0.0 07/18/2020 1819   BASOSABS 0.0 07/18/2020 1819    ASSESSMENT AND PLAN: 1. Major depressive  disorder, recurrent episode, mild (Primary) 2. GAD (generalized anxiety disorder) 3. Bereavement reaction -better with Zoloft  but still grieving the loss of her mother. Agreeable to being referred to a therapist. She would like to stay on the 50 mg of Zoloft  for now without dose increase - sertraline  (ZOLOFT ) 50 MG tablet; Take 1 tablet (50 mg total) by mouth daily.  Dispense: 60 tablet; Refill: 1 - Ambulatory referral to Psychiatry  4. History of hypothyroidism We will recheck thyroid  hormone  level. - TSH+T4F+T3Free  5. Morbid obesity due to excess calories (HCC) Commended her on changes that she is made in her eating habits so far.  She is cooking more and trying to avoid fried foods.  Encouraged her to try to exercise.  She is agreeable to walking with her dog in the neighborhood for 15 minutes most days instead of walking him in the backyard. - CBC - Lipid panel - Comprehensive metabolic panel with GFR  6. History of prediabetes See #5 above.  No longer in the range for prediabetes based on A1c. - POCT glucose (manual entry) - POCT glycosylated hemoglobin (Hb A1C)  7. Palpitations Thyroid  hormone level is normal I recommend referral to cardiology for monitor.  Patient wants to hold off on that for now stating that if the episodes more frequent or last longer she will let me know.  HM: plans to get flu shot through employer  Patient was given the opportunity to ask questions.  Patient verbalized understanding of the plan and was able to repeat key elements of the plan.   This documentation was completed using Paediatric nurse.  Any transcriptional errors are unintentional.  Orders Placed This Encounter  Procedures   CBC   Lipid panel   Comprehensive metabolic panel with GFR   TSH+T4F+T3Free   Ambulatory referral to Psychiatry   POCT glucose (manual entry)   POCT glycosylated hemoglobin (Hb A1C)     Requested Prescriptions   Signed Prescriptions Disp Refills   sertraline  (ZOLOFT ) 50 MG tablet 60 tablet 1    Sig: Take 1 tablet (50 mg total) by mouth daily.    Return in about 4 months (around 03/26/2024).  Barnie Louder, MD, FACP

## 2023-11-26 LAB — COMPREHENSIVE METABOLIC PANEL WITH GFR
ALT: 14 IU/L (ref 0–32)
AST: 21 IU/L (ref 0–40)
Albumin: 4.6 g/dL (ref 3.9–4.9)
Alkaline Phosphatase: 52 IU/L (ref 41–116)
BUN/Creatinine Ratio: 10 (ref 9–23)
BUN: 10 mg/dL (ref 6–20)
Bilirubin Total: <0.2 mg/dL (ref 0.0–1.2)
CO2: 21 mmol/L (ref 20–29)
Calcium: 9.9 mg/dL (ref 8.7–10.2)
Chloride: 103 mmol/L (ref 96–106)
Creatinine, Ser: 0.99 mg/dL (ref 0.57–1.00)
Globulin, Total: 3.3 g/dL (ref 1.5–4.5)
Glucose: 88 mg/dL (ref 70–99)
Potassium: 3.7 mmol/L (ref 3.5–5.2)
Sodium: 139 mmol/L (ref 134–144)
Total Protein: 7.9 g/dL (ref 6.0–8.5)
eGFR: 78 mL/min/1.73 (ref >59)

## 2023-11-26 LAB — TSH+T4F+T3FREE
Free T4: 1.07 ng/dL (ref 0.82–1.77)
T3, Free: 2.6 pg/mL (ref 2.0–4.4)
TSH: 2.04 u[IU]/mL (ref 0.450–4.500)

## 2023-11-26 LAB — LIPID PANEL
Chol/HDL Ratio: 3.4 ratio (ref 0.0–4.4)
Cholesterol, Total: 174 mg/dL (ref 100–199)
HDL: 51 mg/dL (ref >39)
LDL Chol Calc (NIH): 98 mg/dL (ref 0–99)
Triglycerides: 144 mg/dL (ref 0–149)
VLDL Cholesterol Cal: 25 mg/dL (ref 5–40)

## 2023-11-26 LAB — CBC
Hematocrit: 38.1 % (ref 34.0–46.6)
Hemoglobin: 12.2 g/dL (ref 11.1–15.9)
MCH: 29 pg (ref 26.6–33.0)
MCHC: 32 g/dL (ref 31.5–35.7)
MCV: 91 fL (ref 79–97)
Platelets: 377 x10E3/uL (ref 150–450)
RBC: 4.2 x10E6/uL (ref 3.77–5.28)
RDW: 12.9 % (ref 11.7–15.4)
WBC: 8.1 x10E3/uL (ref 3.4–10.8)

## 2023-11-27 ENCOUNTER — Ambulatory Visit: Payer: Self-pay | Admitting: Internal Medicine

## 2023-11-27 LAB — POCT GLYCOSYLATED HEMOGLOBIN (HGB A1C): HbA1c, POC (prediabetic range): 5.6 % — AB (ref 5.7–6.4)

## 2023-11-27 LAB — GLUCOSE, POCT (MANUAL RESULT ENTRY): POC Glucose: 105 mg/dL — AB (ref 70–99)

## 2023-12-09 ENCOUNTER — Telehealth: Admitting: Physician Assistant

## 2023-12-09 ENCOUNTER — Other Ambulatory Visit: Payer: Self-pay

## 2023-12-09 DIAGNOSIS — J0140 Acute pansinusitis, unspecified: Secondary | ICD-10-CM

## 2023-12-09 MED ORDER — AMOXICILLIN-POT CLAVULANATE 875-125 MG PO TABS
1.0000 | ORAL_TABLET | Freq: Two times a day (BID) | ORAL | 0 refills | Status: AC
  Filled 2023-12-09: qty 14, 7d supply, fill #0

## 2023-12-09 NOTE — Progress Notes (Signed)
 E-Visit for Sinus Problems  We are sorry that you are not feeling well.  Here is how we plan to help!  Based on what you have shared with me it looks like you have sinusitis.  Sinusitis is inflammation and infection in the sinus cavities of the head.  Based on your presentation I believe you most likely have Acute Bacterial Sinusitis.  This is an infection caused by bacteria and is treated with antibiotics. I have prescribed Augmentin  875mg /125mg  one tablet twice daily with food, for 7 days.   You may use an oral decongestant such as Mucinex  D or if you have glaucoma or high blood pressure use plain Mucinex . Saline nasal spray help and can safely be used as often as needed for congestion.  If you develop worsening sinus pain, fever or notice severe headache and vision changes, or if symptoms are not better after completion of antibiotic, please schedule an appointment with a health care provider.    Sinus infections are not as easily transmitted as other respiratory infection, however we still recommend that you avoid close contact with loved ones, especially the very young and elderly.  Remember to wash your hands thoroughly throughout the day as this is the number one way to prevent the spread of infection!  Home Care: Only take medications as instructed by your medical team. Complete the entire course of an antibiotic. Do not take these medications with alcohol. A steam or ultrasonic humidifier can help congestion.  You can place a towel over your head and breathe in the steam from hot water coming from a faucet. Avoid close contacts especially the very young and the elderly. Cover your mouth when you cough or sneeze. Always remember to wash your hands.  Get Help Right Away If: You develop worsening fever or sinus pain. You develop a severe head ache or visual changes. Your symptoms persist after you have completed your treatment plan.  Make sure you Understand these  instructions. Will watch your condition. Will get help right away if you are not doing well or get worse.   Please seek an in-person evaluation if the symptoms worsen or if the condition fails to improve as anticipated.  PCP follow-up in 5-7 days  Your e-visit answers were reviewed by a board certified advanced clinical practitioner to complete your personal care plan.  Depending on the condition, your plan could have included both over the counter or prescription medications.  If there is a problem please reply  once you have received a response from your provider.  Your safety is important to us .  If you have drug allergies check your prescription carefully.    You can use MyChart to ask questions about today's visit, request a non-urgent call back, or ask for a work or school excuse for 24 hours related to this e-Visit. If it has been greater than 24 hours you will need to follow up with your provider, or enter a new e-Visit to address those concerns.  You will get an e-mail in the next two days asking about your experience.  I hope that your e-visit has been valuable and will speed your recovery. Thank you for using e-visits.  I have spent 5 minutes in review of e-visit questionnaire, review and updating patient chart, medical decision making and response to patient.   Calen Posch, PA-C

## 2023-12-26 ENCOUNTER — Telehealth: Admitting: Physician Assistant

## 2023-12-26 ENCOUNTER — Other Ambulatory Visit: Payer: Self-pay

## 2023-12-26 DIAGNOSIS — H6991 Unspecified Eustachian tube disorder, right ear: Secondary | ICD-10-CM | POA: Diagnosis not present

## 2023-12-26 DIAGNOSIS — H6501 Acute serous otitis media, right ear: Secondary | ICD-10-CM

## 2023-12-26 MED ORDER — FLUTICASONE PROPIONATE 50 MCG/ACT NA SUSP
2.0000 | Freq: Every day | NASAL | 0 refills | Status: AC
  Filled 2023-12-26: qty 16, 30d supply, fill #0

## 2023-12-26 MED ORDER — AZITHROMYCIN 250 MG PO TABS
ORAL_TABLET | ORAL | 0 refills | Status: AC
  Filled 2023-12-26: qty 6, 5d supply, fill #0

## 2023-12-26 MED ORDER — CIPROFLOXACIN-DEXAMETHASONE 0.3-0.1 % OT SUSP
4.0000 [drp] | Freq: Two times a day (BID) | OTIC | 0 refills | Status: AC
  Filled 2023-12-26: qty 7.5, 19d supply, fill #0

## 2023-12-26 NOTE — Progress Notes (Signed)
 E-Visit for Ear Pain - Acute Otitis Media   We are sorry that you are not feeling well. Here is how we plan to help!  Based on what you have shared with me it looks like you have Acute Otitis Media.  Acute Otitis Media is an infection of the middle or inner ear. This type of infection can cause redness, inflammation, and fluid buildup behind the tympanic membrane (ear drum).  The usual symptoms include: Earache/Pain Fever Upper respiratory symptoms Lack of energy/Fatigue/Malaise Slight hearing loss gradually worsening- if the inner ear fills with fluid What causes middle ear infections? Most middle ear infections occur when an infection such as a cold, leads to a build-up of mucus in the middle ear and causes the Eustachian tube (a thin tube that runs from the middle ear to the back of the nose) to become swollen or blocked.   This means mucus can't drain away properly, making it easier for an infection to spread into the middle ear.  How middle ear infections are treated: Most ear infections clear up within three to five days and don't need any specific treatment. If necessary, tylenol  or ibuprofen  should be used to relieve pain and a high temperature.  If you develop a fever higher than 102, or any significantly worsening symptoms, this could indicate a more serious infection moving to the middle/inner and needs face to face evaluation in an office by a provider.   Antibiotics aren't routinely used to treat middle ear infections, although they may occasionally be prescribed if symptoms persist or are particularly severe. Given your presentation,   I have prescribed Azithromycin  250 mg two tablets by mouth on day 1, then 1 tablet by mouth daily until completed  I have prescribed: Ciprofloxacin  0.3% and dexamethasone  0.1% otic suspension four drops in affected ears two times a day for 7 days  I also think you have Eustachian Tube Dysfunction.  Eustachian Tube Dysfunction is a condition  where the tubes that connect your middle ears to your upper throat become blocked. This can lead to discomfort, hearing difficulties and a feeling of fullness in your ear. Eustachian tube dysfunction usually resolves itself in a few days. The usual symptoms include: Hearing problems Tinnitus, or ringing in your ears Clicking or popping sounds A feeling of fullness in your ears Pain that mimics an ear infection Dizziness, vertigo or balance problems A "tickling" sensation in your ears  ?Eustachian tube dysfunction symptoms may get worse in higher altitudes. This is called barotrauma, and it can happen while scuba diving, flying in an airplane or driving in the mountains.   What causes eustachian tube dysfunction? Allergies and infections (like the common cold and the flu) are the most common causes of eustachian tube dysfunction. These conditions can cause inflammation and mucus buildup, leading to blockage. GERD, or chronic acid reflux, can also cause ETD. This is because stomach acid can back up into your throat and result in inflammation. As mentioned above, altitude changes can also cause ETD.   What are some common eustachian tube dysfunction treatments? In most cases, treatment isn't necessary because ETD often resolves on its own. However, you might need treatment if your symptoms linger for more than two weeks.    Eustachian tube dysfunction treatment depends on the cause and the severity of your condition. Treatments may include home remedies, medications or, in severe cases, surgery.     HOME CARE: Sometimes simple home remedies can help with mild cases of eustachian tube dysfunction.  To try and clear the blockage, you can: Chew gum. Yawn. Swallow. Try the Valsalva maneuver (breathing out forcefully while closing your mouth and pinching your nostrils). Use a saline spray to clear out nasal passages.  MEDICATIONS: Over-the-counter medications can help if allergies are causing  eustachian tube dysfunction. Try antihistamines (like cetirizine or diphenhydramine ) to ease your symptoms. If you have discomfort, pain relievers -- such as acetaminophen  or ibuprofen  -- can help.  Sometimes intranasal glucocorticosteroids (like Flonase  or Nasacort ) help.  I have prescribed Fluticasone  50 mcg/spray 2 sprays in each nostril daily for 10-14 days   Your symptoms should improve over the next 3 days and should resolve in about 7 days. Be sure to complete ALL of the prescription(s) given.  HOME CARE: Wash your hands frequently. If you are prescribed an ear drop, do not place the tip of the bottle on your ear or touch it with your fingers. You can take Acetaminophen  650 mg every 4-6 hours as needed for pain.  If pain is severe or moderate, you can apply a heating pad (set on low) or hot water bottle (wrapped in a towel) to outer ear for 20 minutes.  This will also increase drainage.  GET HELP RIGHT AWAY IF: Fever is over 102.2 degrees. You develop progressive ear pain or hearing loss. Ear symptoms persist longer than 3 days after treatment.  MAKE SURE YOU: Understand these instructions. Will watch your condition. Will get help right away if you are not doing well or get worse.  Thank you for choosing an e-visit.  Your e-visit answers were reviewed by a board certified advanced clinical practitioner to complete your personal care plan. Depending upon the condition, your plan could have included both over the counter or prescription medications.  Please review your pharmacy choice. Make sure the pharmacy is open so you can pick up the prescription now. If there is a problem, you may contact your provider through [employer] and have the prescription routed to another pharmacy.  Your safety is important to us . If you have drug allergies check your prescription carefully.   For the next 24 hours you can use MyChart to ask questions about today's visit, request a non-urgent  call back, or ask for a work or school excuse. You will get an email with a survey after your eVisit asking about your experience. We would appreciate your feedback. I hope that your e-visit has been valuable and will aid in your recovery.   I have spent 5 minutes in review of e-visit questionnaire, review and updating patient chart, medical decision making and response to patient.   Delon CHRISTELLA Dickinson, PA-C

## 2024-01-01 ENCOUNTER — Other Ambulatory Visit: Payer: Self-pay

## 2024-01-01 MED ORDER — FLUZONE 0.5 ML IM SUSY
0.5000 mL | PREFILLED_SYRINGE | Freq: Once | INTRAMUSCULAR | 0 refills | Status: AC
  Filled 2024-01-01: qty 0.5, 1d supply, fill #0

## 2024-01-10 ENCOUNTER — Telehealth: Payer: Self-pay

## 2024-01-10 NOTE — Telephone Encounter (Signed)
 Opened in error

## 2024-01-21 ENCOUNTER — Other Ambulatory Visit: Payer: Self-pay | Admitting: Internal Medicine

## 2024-01-21 DIAGNOSIS — F411 Generalized anxiety disorder: Secondary | ICD-10-CM

## 2024-01-22 ENCOUNTER — Other Ambulatory Visit: Payer: Self-pay

## 2024-01-22 MED ORDER — CLONAZEPAM 0.5 MG PO TABS
0.5000 mg | ORAL_TABLET | Freq: Every day | ORAL | 0 refills | Status: AC | PRN
  Filled 2024-01-22: qty 15, 15d supply, fill #0

## 2024-03-26 ENCOUNTER — Other Ambulatory Visit: Payer: Self-pay

## 2024-03-26 ENCOUNTER — Ambulatory Visit: Attending: Internal Medicine | Admitting: Internal Medicine

## 2024-03-26 ENCOUNTER — Encounter: Payer: Self-pay | Admitting: Internal Medicine

## 2024-03-26 VITALS — BP 120/81 | HR 75 | Temp 97.6°F | Ht 66.0 in | Wt 289.0 lb

## 2024-03-26 DIAGNOSIS — F411 Generalized anxiety disorder: Secondary | ICD-10-CM

## 2024-03-26 DIAGNOSIS — R7303 Prediabetes: Secondary | ICD-10-CM | POA: Diagnosis not present

## 2024-03-26 DIAGNOSIS — J4520 Mild intermittent asthma, uncomplicated: Secondary | ICD-10-CM

## 2024-03-26 DIAGNOSIS — F330 Major depressive disorder, recurrent, mild: Secondary | ICD-10-CM

## 2024-03-26 LAB — POCT GLYCOSYLATED HEMOGLOBIN (HGB A1C): HbA1c, POC (prediabetic range): 5.8 % (ref 5.7–6.4)

## 2024-03-26 MED ORDER — SERTRALINE HCL 100 MG PO TABS
100.0000 mg | ORAL_TABLET | Freq: Every day | ORAL | 1 refills | Status: AC
  Filled 2024-03-26: qty 90, 90d supply, fill #0

## 2024-03-26 NOTE — Progress Notes (Signed)
 "   Patient ID: SHARDEE DIEU, female    DOB: 17-Aug-1990  MRN: [id number]  CC: Follow-up (Pre-diabetes/Discuss Sertraline  dose increase/Already received flu vax)   Subjective: Ashlee Herman is a 34 y.o. female who presents for chronic ds management. Her chronic medical issues include:  Patient with history of mild persistent asthma, hypothyroidism, obesity/prediabetes, GAD/MDD, endometriosis   Patient presents today for chronic disease management.  Prediabetes/obesity:  Results for orders placed or performed in visit on 03/26/24  POCT glycosylated hemoglobin (Hb A1C)   Collection Time: 03/26/24  5:12 PM  Result Value Ref Range   Hemoglobin A1C     HbA1c POC (<> result, manual entry)     HbA1c, POC (prediabetic range) 5.8 5.7 - 6.4 %   HbA1c, POC (controlled diabetic range)      She reports that she is splurged over the holiday season in regards to her eating.  She has since gotten back on track.  She has started cooking more instead of eating out.  Obtained gym membership and plans to start going with her boyfriend who recently started going to the gym to exercise.  MDD/GAD/bereavement: This was the first Christmas holiday without her mother.  She spent time with her father. Recently started waking up around 3 AM in the mornings with racing thoughts about her mom who passed last year and work/life stresses.  She wonders whether she needs a higher dose of Zoloft .  Currently on 50 mg.  Asthma: No recent flares.  Uses albuterol  inhaler when needed.  She has not yet followed up with her gynecologist for endometriosis and to have Pap smear done.  She plans to do so in the near future.  Patient Active Problem List   Diagnosis Date Noted   Disorder of thyroid  gland 03/25/2023   Chronic pelvic pain in female 03/25/2023   Contraception management 03/25/2023   Irregular periods 03/25/2023   Menorrhagia 03/25/2023   Migraine 03/25/2023   Pain in pelvis 03/25/2023   Acute otitis externa  of both ears 10/06/2018   Acute sore throat 10/06/2018   Endometriosis 09/16/2018   Atopic dermatitis 09/11/2018   Herpes simplex 08/14/2017   Dyspareunia in female 07/24/2017   Generalized anxiety disorder 02/06/2017   Mild persistent asthma without complication 03/19/2016   Prediabetes    Obesity    Hypertension    Dyspepsia    Pre-diabetes 06/12/2010     Medications Ordered Prior to Encounter[1]  Allergies[2]  Social History   Socioeconomic History   Marital status: Single    Spouse name: Not on file   Number of children: Not on file   Years of education: Not on file   Highest education level: Some college, no degree  Occupational History   Not on file  Tobacco Use   Smoking status: Never   Smokeless tobacco: Never  Vaping Use   Vaping status: Never Used  Substance and Sexual Activity   Alcohol use: Yes    Comment: Occasional drinker   Drug use: No   Sexual activity: Yes    Birth control/protection: None  Other Topics Concern   Not on file  Social History Narrative   Not on file   Social Drivers of Health   Tobacco Use: Low Risk (11/25/2023)   Patient History    Smoking Tobacco Use: Never    Smokeless Tobacco Use: Never    Passive Exposure: Not on file  Financial Resource Strain: Low Risk (09/24/2023)   Overall Financial Resource Strain (CARDIA)  Difficulty of Paying Living Expenses: Not very hard  Food Insecurity: No Food Insecurity (09/24/2023)   Epic    Worried About Programme Researcher, Broadcasting/film/video in the Last Year: Never true    Ran Out of Food in the Last Year: Never true  Transportation Needs: No Transportation Needs (09/24/2023)   Epic    Lack of Transportation (Medical): No    Lack of Transportation (Non-Medical): No  Physical Activity: Insufficiently Active (09/24/2023)   Exercise Vital Sign    Days of Exercise per Week: 2 days    Minutes of Exercise per Session: 30 min  Stress: Stress Concern Present (09/24/2023)   [employer] of Occupational  Health - Occupational Stress Questionnaire    Feeling of Stress: Very much  Social Connections: Moderately Integrated (09/24/2023)   Social Connection and Isolation Panel    Frequency of Communication with Friends and Family: More than three times a week    Frequency of Social Gatherings with Friends and Family: Twice a week    Attends Religious Services: 1 to 4 times per year    Active Member of [employer] or Organizations: No    Attends Banker Meetings: Not on file    Marital Status: Living with partner  Intimate Partner Violence: Not At Risk (09/24/2023)   Epic    Fear of Current or Ex-Partner: No    Emotionally Abused: No    Physically Abused: No    Sexually Abused: No  Depression (PHQ2-9): Low Risk (03/26/2024)   Depression (PHQ2-9)    PHQ-2 Score: 3  Alcohol Screen: Low Risk (09/24/2023)   Alcohol Screen    Last Alcohol Screening Score (AUDIT): 1  Housing: Low Risk (09/24/2023)   Epic    Unable to Pay for Housing in the Last Year: No    Number of Times Moved in the Last Year: 0    Homeless in the Last Year: No  Utilities: Not At Risk (09/24/2023)   Epic    Threatened with loss of utilities: No  Health Literacy: Adequate Health Literacy (09/24/2023)   B1300 Health Literacy    Frequency of need for help with medical instructions: Never    Family History  Problem Relation Age of Onset   Obesity Mother    Hypertension Mother    Anxiety disorder Mother    Arthritis Mother    Asthma Mother    Diabetes Mother    Miscarriages / Stillbirths Mother    Obesity Father    Alcohol abuse Father    Arthritis Father    Diabetes Father    Kidney disease Father    Stroke Father    Cancer Maternal Grandmother    Varicose Veins Sister    Varicose Veins Sister    Thyroid  disease Neg Hx     Past Surgical History:  Procedure Laterality Date   CLEFT PALATE REPAIR     TYMPANOSTOMY TUBE PLACEMENT      ROS: Review of Systems Negative except as stated above  PHYSICAL  EXAM: BP 120/81 (BP Location: Left Arm, Patient Position: Sitting, Cuff Size: Normal)   Pulse 75   Temp 97.6 F (36.4 C) (Oral)   Ht 5' 6 (1.676 m)   Wt 289 lb (131.1 kg)   SpO2 99%   BMI 46.65 kg/m   Wt Readings from Last 3 Encounters:  03/26/24 289 lb (131.1 kg)  11/25/23 285 lb (129.3 kg)  09/24/23 288 lb (130.6 kg)    Physical Exam  General appearance - alert, well  appearing, and in no distress Mental status - normal mood, behavior, speech, dress, motor activity, and thought processes Chest - clear to auscultation, no wheezes, rales or rhonchi, symmetric air entry Heart - normal rate, regular rhythm, normal S1, S2, no murmurs, rubs, clicks or gallops Extremities - peripheral pulses normal, no pedal edema, no clubbing or cyanosis     03/26/2024    5:13 PM 11/25/2023    3:54 PM 09/24/2023    4:36 PM  Depression screen PHQ 2/9  Decreased Interest 0 1 2  Down, Depressed, Hopeless 1 0 3  PHQ - 2 Score 1 1 5   Altered sleeping 1 1 2   Tired, decreased energy 0 1 2  Change in appetite 1 0 0  Feeling bad or failure about yourself  0 0 0  Trouble concentrating 0 0 0  Moving slowly or fidgety/restless 0 0 0  Suicidal thoughts 0 0 0  PHQ-9 Score 3 3  9    Difficult doing work/chores Not difficult at all Not difficult at all Very difficult     Data saved with a previous flowsheet row definition      03/26/2024    5:14 PM 11/25/2023    3:54 PM 09/24/2023    4:36 PM 01/21/2023    1:32 PM  GAD 7 : Generalized Anxiety Score  Nervous, Anxious, on Edge 1 2  3  1    Control/stop worrying 1 2  3  1    Worry too much - different things 1 1  3  1    Trouble relaxing 1 1  2  1    Restless 0 0  1  0   Easily annoyed or irritable 0 0  1  0   Afraid - awful might happen 0 0  3  1   Total GAD 7 Score 4 6 16 5   Anxiety Difficulty Not difficult at all Somewhat difficult Very difficult Not difficult at all     Data saved with a previous flowsheet row definition         Latest Ref Rng &  Units 11/25/2023    4:36 PM 01/21/2023   10:02 AM 10/10/2019    5:37 PM  CMP  Glucose 70 - 99 mg/dL 88  99  99   BUN 6 - 20 mg/dL 10  10  9    Creatinine 0.57 - 1.00 mg/dL 9.00  9.13  9.14   Sodium 134 - 144 mmol/L 139  139  136   Potassium 3.5 - 5.2 mmol/L 3.7  4.5  4.0   Chloride 96 - 106 mmol/L 103  105  100   CO2 20 - 29 mmol/L 21  21  24    Calcium 8.7 - 10.2 mg/dL 9.9  9.6  9.7   Total Protein 6.0 - 8.5 g/dL 7.9  7.6  8.7   Total Bilirubin 0.0 - 1.2 mg/dL <9.7  <9.7  0.3   Alkaline Phos 41 - 116 IU/L 52  44  28   AST 0 - 40 IU/L 21  21  44   ALT 0 - 32 IU/L 14  13  34    Lipid Panel     Component Value Date/Time   CHOL 174 11/25/2023 1636   TRIG 144 11/25/2023 1636   HDL 51 11/25/2023 1636   CHOLHDL 3.4 11/25/2023 1636   LDLCALC 98 11/25/2023 1636    CBC    Component Value Date/Time   WBC 8.1 11/25/2023 1636   WBC 7.0 07/18/2020 1819  RBC 4.20 11/25/2023 1636   RBC 4.34 07/18/2020 1819   HGB 12.2 11/25/2023 1636   HCT 38.1 11/25/2023 1636   PLT 377 11/25/2023 1636   MCV 91 11/25/2023 1636   MCH 29.0 11/25/2023 1636   MCH 30.2 07/18/2020 1819   MCHC 32.0 11/25/2023 1636   MCHC 33.8 07/18/2020 1819   RDW 12.9 11/25/2023 1636   LYMPHSABS 0.7 07/18/2020 1819   MONOABS 0.7 07/18/2020 1819   EOSABS 0.0 07/18/2020 1819   BASOSABS 0.0 07/18/2020 1819    ASSESSMENT AND PLAN: 1. Prediabetes (Primary) 2. Morbid obesity due to excess calories (HCC) -Commended her on trying to get back on track with her eating habits by doing more meal planning.  Encouraged her to keep up the good work. Strongly encouraged her to take advantage of the gym membership that she has obtained by accompanying her boyfriend to the gym so that they can support each other. - POCT glucose (manual entry) - POCT glycosylated hemoglobin (Hb A1C)  3. Major depressive disorder, recurrent episode, mild 4. GAD (generalized anxiety disorder) Getting better PHQ9 and GAD7 screenings reviewed that were  done today. Continue Zoloft . We discussed increasing the dose to 100 mg to better control anxiety/racing thoughts - sertraline  (ZOLOFT ) 100 MG tablet; Take 1 tablet (100 mg total) by mouth daily.  Dispense: 90 tablet; Refill: 1  5. Mild intermittent asthma without complication Stable. Continue Albuterol  PRN  Patient was given the opportunity to ask questions.  Patient verbalized understanding of the plan and was able to repeat key elements of the plan.   This documentation was completed using Paediatric nurse.  Any transcriptional errors are unintentional.  Orders Placed This Encounter  Procedures   POCT glucose (manual entry)   POCT glycosylated hemoglobin (Hb A1C)     Requested Prescriptions   Signed Prescriptions Disp Refills   sertraline  (ZOLOFT ) 100 MG tablet 90 tablet 1    Sig: Take 1 tablet (100 mg total) by mouth daily.    Return in about 6 months (around 09/23/2024).  Barnie Louder, MD, FACP     [1]  Current Outpatient Medications on File Prior to Visit  Medication Sig Dispense Refill   albuterol  (PROVENTIL ) (2.5 MG/3ML) 0.083% nebulizer solution Take 3 mLs (2.5 mg total) by nebulization every 6 (six) hours as needed for wheezing. 500 mL 0   albuterol  (VENTOLIN  HFA) 108 (90 Base) MCG/ACT inhaler Inhale 1-2 puffs into the lungs every 6 (six) hours as needed for wheezing or shortness of breath. 8 g 4   clonazePAM  (KLONOPIN ) 0.5 MG tablet Take 1 tablet (0.5 mg total) by mouth daily as needed for anxiety. Limited one time refill. 15 tablet 0   cyclobenzaprine  (FLEXERIL ) 10 MG tablet Take 1 tablet (10 mg total) by mouth daily as needed for muscle spasms. 30 tablet 1   fluticasone  (FLONASE ) 50 MCG/ACT nasal spray Place 2 sprays into both nostrils daily. 16 g 0   No current facility-administered medications on file prior to visit.  [2]  Allergies Allergen Reactions   Tramadol  Swelling   Citrus Itching and Other (See Comments)   Lexapro   [Escitalopram ]     Jaw stiffness   "

## 2024-03-26 NOTE — Patient Instructions (Signed)
 Increase Zoloft  to 100 mg daily.  Healthy Eating, Adult Healthy eating may help you get and keep a healthy body weight, reduce the risk of chronic disease, and live a long and productive life. It is important to follow a healthy eating pattern. Your nutritional and calorie needs should be met mainly by different nutrient-rich foods. What are tips for following this plan? Reading food labels Read labels and choose the following: Reduced or low sodium products. Juices with 100% fruit juice. Foods with low saturated fats (<3 g per serving) and high polyunsaturated and monounsaturated fats. Foods with whole grains, such as whole wheat, cracked wheat, brown rice, and wild rice. Whole grains that are fortified with folic acid. This is recommended for females who are pregnant or who want to become pregnant. Read labels and do not eat or drink the following: Foods or drinks with added sugars. These include foods that contain brown sugar, corn sweetener, corn syrup, dextrose, fructose, glucose, high-fructose corn syrup, honey, invert sugar, lactose, malt syrup, maltose, molasses, raw sugar, sucrose, trehalose, or turbinado sugar. Limit your intake of added sugars to less than 10% of your total daily calories. Do not eat more than the following amounts of added sugar per day: 6 teaspoons (25 g) for females. 9 teaspoons (38 g) for males. Foods that contain processed or refined starches and grains. Refined grain products, such as white flour, degermed cornmeal, white bread, and white rice. Shopping Choose nutrient-rich snacks, such as vegetables, whole fruits, and nuts. Avoid high-calorie and high-sugar snacks, such as potato chips, fruit snacks, and candy. Use oil-based dressings and spreads on foods instead of solid fats such as butter, margarine, sour cream, or cream cheese. Limit pre-made sauces, mixes, and instant products such as flavored rice, instant noodles, and ready-made pasta. Try more  plant-protein sources, such as tofu, tempeh, black beans, edamame, lentils, nuts, and seeds. Explore eating plans such as the Mediterranean diet or vegetarian diet. Try heart-healthy dips made with beans and healthy fats like hummus and guacamole. Vegetables go great with these. Cooking Use oil to saut or stir-fry foods instead of solid fats such as butter, margarine, or lard. Try baking, boiling, grilling, or broiling instead of frying. Remove the fatty part of meats before cooking. Steam vegetables in water or broth. Meal planning  At meals, imagine dividing your plate into fourths: One-half of your plate is fruits and vegetables. One-fourth of your plate is whole grains. One-fourth of your plate is protein, especially lean meats, poultry, eggs, tofu, beans, or nuts. Include low-fat dairy as part of your daily diet. Lifestyle Choose healthy options in all settings, including home, work, school, restaurants, or stores. Prepare your food safely: Wash your hands after handling raw meats. Where you prepare food, keep surfaces clean by regularly washing with hot, soapy water. Keep raw meats separate from ready-to-eat foods, such as fruits and vegetables. Cook seafood, meat, poultry, and eggs to the recommended temperature. Get a food thermometer. Store foods at safe temperatures. In general: Keep cold foods at 32F (4.4C) or below. Keep hot foods at 132F (60C) or above. Keep your freezer at [facility] (-17.8C) or below. Foods are not safe to eat if they have been between the temperatures of 40-132F (4.4-60C) for more than 2 hours. What foods should I eat? Fruits Aim to eat 1-2 cups of fresh, canned (in natural juice), or frozen fruits each day. One cup of fruit equals 1 small apple, 1 large banana, 8 large strawberries, 1 cup (237 g) canned  fruit,  cup (82 g) dried fruit, or 1 cup (240 mL) 100% juice. Vegetables Aim to eat 2-4 cups of fresh and frozen vegetables each day, including  different varieties and colors. One cup of vegetables equals 1 cup (91 g) broccoli or cauliflower florets, 2 medium carrots, 2 cups (150 g) raw, leafy greens, 1 large tomato, 1 large bell pepper, 1 large sweet potato, or 1 medium white potato. Grains Aim to eat 5-10 ounce-equivalents of whole grains each day. Examples of 1 ounce-equivalent of grains include 1 slice of bread, 1 cup (40 g) ready-to-eat cereal, 3 cups (24 g) popcorn, or  cup (93 g) cooked rice. Meats and other proteins Try to eat 5-7 ounce-equivalents of protein each day. Examples of 1 ounce-equivalent of protein include 1 egg,  oz nuts (12 almonds, 24 pistachios, or 7 walnut halves), 1/4 cup (90 g) cooked beans, 6 tablespoons (90 g) hummus or 1 tablespoon (16 g) peanut butter. A cut of meat or fish that is the size of a deck of cards is about 3-4 ounce-equivalents (85 g). Of the protein you eat each week, try to have at least 8 sounce (227 g) of seafood. This is about 2 servings per week. This includes salmon, trout, herring, sardines, and anchovies. Dairy Aim to eat 3 cup-equivalents of fat-free or low-fat dairy each day. Examples of 1 cup-equivalent of dairy include 1 cup (240 mL) milk, 8 ounces (250 g) yogurt, 1 ounces (44 g) natural cheese, or 1 cup (240 mL) fortified soy milk. Fats and oils Aim for about 5 teaspoons (21 g) of fats and oils per day. Choose monounsaturated fats, such as canola and olive oils, mayonnaise made with olive oil or avocado oil, avocados, peanut butter, and most nuts, or polyunsaturated fats, such as sunflower, corn, and soybean oils, walnuts, pine nuts, sesame seeds, sunflower seeds, and flaxseed. Beverages Aim for 6 eight-ounce glasses of water per day. Limit coffee to 3-5 eight-ounce cups per day. Limit caffeinated beverages that have added calories, such as soda and energy drinks. If you drink alcohol: Limit how much you have to: 0-1 drink a day if you are female. 0-2 drinks a day if you are  female. Know how much alcohol is in your drink. In the [place] one drink is one 12 oz bottle of beer (355 mL), one 5 oz glass of wine (148 mL), or one 1 oz glass of hard liquor (44 mL). Seasoning and other foods Try not to add too much salt to your food. Try using herbs and spices instead of salt. Try not to add sugar to food. This information is based on [place] nutrition guidelines. To learn more, visit disposablenylon.be. Exact amounts may vary. You may need different amounts. This information is not intended to replace advice given to you by your health care provider. Make sure you discuss any questions you have with your health care provider. Document Revised: 11/20/2021 Document Reviewed: 11/20/2021 Elsevier Patient Education  2024 [employer].

## 2024-09-24 ENCOUNTER — Ambulatory Visit: Payer: Self-pay | Admitting: Internal Medicine
